# Patient Record
Sex: Female | Born: 1970 | Race: White | Hispanic: No | Marital: Single | Smoking: Never smoker
Health system: Southern US, Community
[De-identification: ages and names within clinical notes are randomized; demographics above are authoritative.]

## PROBLEM LIST (undated history)

## (undated) DIAGNOSIS — E8881 Metabolic syndrome: Secondary | ICD-10-CM

## (undated) DIAGNOSIS — E282 Polycystic ovarian syndrome: Secondary | ICD-10-CM

## (undated) DIAGNOSIS — T7840XA Allergy, unspecified, initial encounter: Secondary | ICD-10-CM

## (undated) DIAGNOSIS — I10 Essential (primary) hypertension: Secondary | ICD-10-CM

## (undated) HISTORY — DX: Essential (primary) hypertension: I10

## (undated) HISTORY — PX: DILATION AND CURETTAGE OF UTERUS: SHX78

## (undated) HISTORY — DX: Polycystic ovarian syndrome: E28.2

## (undated) HISTORY — PX: OTHER SURGICAL HISTORY: SHX169

## (undated) HISTORY — DX: Metabolic syndrome: E88.81

## (undated) HISTORY — DX: Allergy, unspecified, initial encounter: T78.40XA

## (undated) HISTORY — DX: Metabolic syndrome: E88.810

---

## 2007-05-14 ENCOUNTER — Encounter: Payer: Self-pay | Admitting: Family

## 2007-05-14 ENCOUNTER — Ambulatory Visit: Payer: Self-pay | Admitting: Family

## 2007-05-14 LAB — CONVERTED CEMR LAB

## 2007-05-17 ENCOUNTER — Encounter: Admission: RE | Admit: 2007-05-17 | Discharge: 2007-05-17 | Payer: Self-pay | Admitting: Obstetrics & Gynecology

## 2007-05-17 ENCOUNTER — Ambulatory Visit: Payer: Self-pay | Admitting: Family Medicine

## 2007-05-17 DIAGNOSIS — E119 Type 2 diabetes mellitus without complications: Secondary | ICD-10-CM

## 2007-05-17 DIAGNOSIS — E282 Polycystic ovarian syndrome: Secondary | ICD-10-CM

## 2007-05-17 DIAGNOSIS — I10 Essential (primary) hypertension: Secondary | ICD-10-CM | POA: Insufficient documentation

## 2007-05-17 DIAGNOSIS — J309 Allergic rhinitis, unspecified: Secondary | ICD-10-CM | POA: Insufficient documentation

## 2007-05-25 ENCOUNTER — Encounter: Payer: Self-pay | Admitting: Family Medicine

## 2007-05-25 ENCOUNTER — Telehealth (INDEPENDENT_AMBULATORY_CARE_PROVIDER_SITE_OTHER): Payer: Self-pay | Admitting: *Deleted

## 2007-05-29 ENCOUNTER — Encounter: Payer: Self-pay | Admitting: Family Medicine

## 2007-05-30 ENCOUNTER — Encounter: Payer: Self-pay | Admitting: Family Medicine

## 2007-05-30 LAB — CONVERTED CEMR LAB
Albumin: 4.2 g/dL (ref 3.5–5.2)
Alkaline Phosphatase: 62 units/L (ref 39–117)
CO2: 18 meq/L — ABNORMAL LOW (ref 19–32)
Chloride: 104 meq/L (ref 96–112)
Glucose, Bld: 113 mg/dL — ABNORMAL HIGH (ref 70–99)
HDL: 45 mg/dL (ref >39)
MCHC: 32.9 g/dL (ref 30.0–36.0)
MCV: 91.9 fL (ref 78.0–100.0)
Platelets: 252 10*3/uL (ref 150–400)
Sodium: 135 meq/L (ref 135–145)
Total Bilirubin: 1 mg/dL (ref 0.3–1.2)
Total CHOL/HDL Ratio: 3.6
Triglycerides: 139 mg/dL (ref <150)
WBC: 4.4 10*3/uL (ref 4.0–10.5)

## 2007-05-31 ENCOUNTER — Encounter: Payer: Self-pay | Admitting: Family Medicine

## 2007-06-01 ENCOUNTER — Ambulatory Visit: Payer: Self-pay | Admitting: Family Medicine

## 2007-06-04 ENCOUNTER — Ambulatory Visit: Payer: Self-pay | Admitting: Family

## 2007-06-06 ENCOUNTER — Telehealth: Payer: Self-pay | Admitting: Family Medicine

## 2007-06-06 ENCOUNTER — Encounter: Admission: RE | Admit: 2007-06-06 | Discharge: 2007-06-06 | Payer: Self-pay | Admitting: Family Medicine

## 2007-06-11 ENCOUNTER — Telehealth: Payer: Self-pay | Admitting: Family Medicine

## 2007-06-12 ENCOUNTER — Ambulatory Visit: Payer: Self-pay | Admitting: Family Medicine

## 2007-06-13 ENCOUNTER — Encounter: Payer: Self-pay | Admitting: Family Medicine

## 2007-06-13 ENCOUNTER — Telehealth: Payer: Self-pay | Admitting: Family Medicine

## 2007-06-13 LAB — CONVERTED CEMR LAB
Basophils Absolute: 0 10*3/uL (ref 0.0–0.1)
Eosinophils Absolute: 0.2 10*3/uL (ref 0.0–0.7)
Eosinophils Relative: 5 % (ref 0–5)
Hemoglobin: 12.1 g/dL (ref 12.0–15.0)
Lymphocytes Relative: 28 % (ref 12–46)
Lymphs Abs: 1 10*3/uL (ref 0.7–4.0)
MCHC: 32.7 g/dL (ref 30.0–36.0)
Neutro Abs: 2.1 10*3/uL (ref 1.7–7.7)
Platelets: 229 10*3/uL (ref 150–400)
RBC: 4.07 M/uL (ref 3.87–5.11)
RDW: 13 % (ref 11.5–15.5)

## 2007-06-14 ENCOUNTER — Encounter: Payer: Self-pay | Admitting: Family Medicine

## 2007-06-15 ENCOUNTER — Ambulatory Visit: Payer: Self-pay | Admitting: Family Medicine

## 2007-06-16 LAB — CONVERTED CEMR LAB
EBV NA IgG: 5.02 — ABNORMAL HIGH
EBV VCA IgM: 0.25

## 2007-07-03 ENCOUNTER — Encounter: Payer: Self-pay | Admitting: Family Medicine

## 2007-07-17 ENCOUNTER — Ambulatory Visit: Payer: Self-pay | Admitting: Obstetrics and Gynecology

## 2007-11-27 ENCOUNTER — Encounter: Payer: Self-pay | Admitting: Family Medicine

## 2007-12-07 ENCOUNTER — Encounter: Payer: Self-pay | Admitting: Family

## 2007-12-07 ENCOUNTER — Ambulatory Visit: Payer: Self-pay | Admitting: Family

## 2007-12-18 ENCOUNTER — Encounter: Payer: Self-pay | Admitting: Family Medicine

## 2008-02-07 ENCOUNTER — Ambulatory Visit: Payer: Self-pay | Admitting: Family Medicine

## 2008-02-07 DIAGNOSIS — F529 Unspecified sexual dysfunction not due to a substance or known physiological condition: Secondary | ICD-10-CM | POA: Insufficient documentation

## 2008-02-07 LAB — CONVERTED CEMR LAB
Glucose, 2 hr postprandial: 128 mg/dL
Hgb A1c MFr Bld: 5.8 %

## 2008-02-29 ENCOUNTER — Ambulatory Visit: Payer: Self-pay | Admitting: Family Medicine

## 2008-02-29 DIAGNOSIS — H8110 Benign paroxysmal vertigo, unspecified ear: Secondary | ICD-10-CM

## 2008-02-29 LAB — CONVERTED CEMR LAB: Glucose, Bld: 170 mg/dL

## 2008-03-07 ENCOUNTER — Ambulatory Visit: Payer: Self-pay | Admitting: Family Medicine

## 2008-03-07 DIAGNOSIS — M549 Dorsalgia, unspecified: Secondary | ICD-10-CM | POA: Insufficient documentation

## 2008-05-15 ENCOUNTER — Encounter: Admission: RE | Admit: 2008-05-15 | Discharge: 2008-05-15 | Payer: Self-pay | Admitting: Family Medicine

## 2008-05-15 ENCOUNTER — Ambulatory Visit: Payer: Self-pay | Admitting: Family Medicine

## 2008-05-15 DIAGNOSIS — R002 Palpitations: Secondary | ICD-10-CM

## 2008-05-25 ENCOUNTER — Encounter: Payer: Self-pay | Admitting: Family Medicine

## 2008-05-26 ENCOUNTER — Encounter: Admission: RE | Admit: 2008-05-26 | Discharge: 2008-05-26 | Payer: Self-pay | Admitting: Obstetrics & Gynecology

## 2008-05-26 ENCOUNTER — Telehealth: Payer: Self-pay | Admitting: Family Medicine

## 2008-06-12 ENCOUNTER — Ambulatory Visit: Payer: Self-pay | Admitting: Family Medicine

## 2008-06-13 ENCOUNTER — Encounter: Payer: Self-pay | Admitting: Family Medicine

## 2008-06-13 LAB — CONVERTED CEMR LAB
ALT: 42 units/L — ABNORMAL HIGH (ref 0–35)
AST: 22 units/L (ref 0–37)
Alkaline Phosphatase: 56 units/L (ref 39–117)
CO2: 20 meq/L (ref 19–32)
Calcium: 9.3 mg/dL (ref 8.4–10.5)
Cholesterol: 168 mg/dL (ref 0–200)
Glucose, Bld: 107 mg/dL — ABNORMAL HIGH (ref 70–99)
Hgb A1c MFr Bld: 5.8 % (ref 4.6–6.1)
Sodium: 137 meq/L (ref 135–145)
Total CHOL/HDL Ratio: 3.8
Total Protein: 7.3 g/dL (ref 6.0–8.3)

## 2008-06-16 ENCOUNTER — Encounter: Payer: Self-pay | Admitting: Family

## 2008-06-16 ENCOUNTER — Ambulatory Visit: Payer: Self-pay | Admitting: Family

## 2008-12-22 ENCOUNTER — Ambulatory Visit: Payer: Self-pay | Admitting: Family Medicine

## 2008-12-29 ENCOUNTER — Telehealth: Payer: Self-pay | Admitting: Family Medicine

## 2009-01-07 ENCOUNTER — Telehealth: Payer: Self-pay | Admitting: Family Medicine

## 2009-01-15 ENCOUNTER — Encounter: Payer: Self-pay | Admitting: Family Medicine

## 2009-03-10 ENCOUNTER — Encounter: Payer: Self-pay | Admitting: Family Medicine

## 2009-04-13 ENCOUNTER — Telehealth: Payer: Self-pay | Admitting: Family Medicine

## 2009-04-15 ENCOUNTER — Ambulatory Visit: Payer: Self-pay | Admitting: Family Medicine

## 2009-04-15 ENCOUNTER — Encounter: Admission: RE | Admit: 2009-04-15 | Discharge: 2009-04-15 | Payer: Self-pay | Admitting: Family Medicine

## 2009-04-15 DIAGNOSIS — M542 Cervicalgia: Secondary | ICD-10-CM

## 2009-04-15 DIAGNOSIS — M79609 Pain in unspecified limb: Secondary | ICD-10-CM | POA: Insufficient documentation

## 2009-04-17 ENCOUNTER — Encounter: Admission: RE | Admit: 2009-04-17 | Discharge: 2009-04-17 | Payer: Self-pay | Admitting: Family Medicine

## 2009-04-17 LAB — HM MAMMOGRAPHY: HM Mammogram: NEGATIVE

## 2009-06-01 ENCOUNTER — Encounter: Admission: RE | Admit: 2009-06-01 | Discharge: 2009-06-01 | Payer: Self-pay | Admitting: Obstetrics & Gynecology

## 2009-06-02 ENCOUNTER — Encounter: Payer: Self-pay | Admitting: Family Medicine

## 2009-06-18 ENCOUNTER — Ambulatory Visit: Payer: Self-pay | Admitting: Family Medicine

## 2009-06-18 DIAGNOSIS — T887XXA Unspecified adverse effect of drug or medicament, initial encounter: Secondary | ICD-10-CM | POA: Insufficient documentation

## 2009-06-26 ENCOUNTER — Ambulatory Visit: Payer: Self-pay | Admitting: Family

## 2009-07-02 ENCOUNTER — Ambulatory Visit: Payer: Self-pay | Admitting: Family Medicine

## 2009-07-27 ENCOUNTER — Telehealth: Payer: Self-pay | Admitting: Family Medicine

## 2009-07-28 ENCOUNTER — Telehealth: Payer: Self-pay | Admitting: Family Medicine

## 2009-07-29 ENCOUNTER — Encounter: Payer: Self-pay | Admitting: Family Medicine

## 2009-08-17 ENCOUNTER — Ambulatory Visit: Payer: Self-pay | Admitting: Obstetrics and Gynecology

## 2009-08-26 HISTORY — PX: HEEL SPUR EXCISION: SHX1733

## 2009-08-26 HISTORY — PX: BREAST REDUCTION SURGERY: SHX8

## 2009-09-07 ENCOUNTER — Ambulatory Visit: Payer: Self-pay | Admitting: Family Medicine

## 2009-09-07 DIAGNOSIS — J019 Acute sinusitis, unspecified: Secondary | ICD-10-CM

## 2010-04-27 NOTE — Assessment & Plan Note (Signed)
Summary: 2 week fu HTN   Vital Signs:  Patient profile:   40 year old female Height:      63.5 inches Weight:      186 pounds Pulse rate:   86 / minute BP sitting:   136 / 88  (left arm) Cuff size:   regular  Vitals Entered By: Kathlene November (July 02, 2009 4:16 PM) CC: recheck BP- been running 140's/90's   Primary Care Provider:  Nani Gasser MD  CC:  recheck BP- been running 140's/90's.  History of Present Illness: recheck BP- been running 140's/90's.  Hasn't been taking the HCTZ.  Says she will start it.    Current Medications (verified): 1)  Metformin Hcl 500 Mg  Tabs (Metformin Hcl) .... Take One Tablet By Mouth Once A Day 2)  Fluticasone Propionate 50 Mcg/act  Susp (Fluticasone Propionate) .... 2 Sprays in Each Nostril Daily For Allergies 3)  Glucose Monitor Strips and Lancets. .... Accuchek 4)  Nuvaring 0.12-0.015 Mg/24hr Ring (Etonogestrel-Ethinyl Estradiol) 5)  Hydrochlorothiazide 12.5 Mg Caps (Hydrochlorothiazide) .... Take 1 Tablet By Mouth Once A Day 6)  Meclizine Hcl 25 Mg Tabs (Meclizine Hcl) .... One By Mouth Three Times A Day As Needed For Dizziness 7)  Claritin Reditabs 10 Mg Tbdp (Loratadine) .... Take One Tablet By Mouth As Needed  Allergies (verified): 1)  ! Tussionex Pennkinetic Er (Chlorpheniramine-Hydrocodone) 2)  ! Bactrim Ds (Sulfamethoxazole-Trimethoprim)  Physical Exam  General:  Well-developed,well-nourished,in no acute distress; alert,appropriate and cooperative throughout examination Lungs:  Normal respiratory effort, chest expands symmetrically. Lungs are clear to auscultation, no crackles or wheezes. Heart:  Normal rate and regular rhythm. S1 and S2 normal without gallop, murmur, click, rub or other extra sounds. Skin:  no rashes.   Psych:  Cognition and judgment appear intact. Alert and cooperative with normal attention span and concentration. No apparent delusions, illusions, hallucinations   Impression & Recommendations:  Problem #  1:  HYPERTENSION, BENIGN (ICD-401.1) Encourage her to really work on her weight loss and reviewed the DASH diet.  If doing well at that point then can work on decreasing the medication.   Her updated medication list for this problem includes:    Hydrochlorothiazide 12.5 Mg Caps (Hydrochlorothiazide) .Marland Kitchen... Take 1 tablet by mouth once a day  Complete Medication List: 1)  Metformin Hcl 500 Mg Tabs (Metformin hcl) .... Take one tablet by mouth once a day 2)  Fluticasone Propionate 50 Mcg/act Susp (Fluticasone propionate) .... 2 sprays in each nostril daily for allergies 3)  Glucose Monitor Strips and Lancets.  .... Accuchek 4)  Nuvaring 0.12-0.015 Mg/24hr Ring (Etonogestrel-ethinyl estradiol) 5)  Hydrochlorothiazide 12.5 Mg Caps (Hydrochlorothiazide) .... Take 1 tablet by mouth once a day 6)  Meclizine Hcl 25 Mg Tabs (Meclizine hcl) .... One by mouth three times a day as needed for dizziness 7)  Claritin Reditabs 10 Mg Tbdp (Loratadine) .... Take one tablet by mouth as needed 8)  Differin 0.1 % Gel (Adapalene) .... Apply once a day as needed.  Patient Instructions: 1)  DASH diet.  (nih/gov).  2)  Please schedule a follow-up appointment in 3 months .

## 2010-04-27 NOTE — Assessment & Plan Note (Signed)
Summary: BAck and Neck pain s/p fall   Vital Signs:  Patient profile:   40 year old female Height:      63.5 inches Weight:      182 pounds BMI:     31.85 Pulse rate:   90 / minute BP sitting:   147 / 100  (left arm) Cuff size:   regular  Vitals Entered By: Kathlene November (April 15, 2009 10:03 AM) CC: fell monday morning in shower - having alot of neck and back pain- fell again yesterday on ice. Also felt lump under right breast   Primary Care Provider:  Nani Gasser MD  CC:  fell monday morning in shower - having alot of neck and back pain- fell again yesterday on ice. Also felt lump under right breast.  History of Present Illness: fell monday morning in shower  after feeling dizzy, then lost her balance. Fell backward- having alot of neck and back pain- fell again yesterday on ice.  Fell on her right hand.  Now has a HA and radiates down into her neck and back.  Has been in the bed for a cuple of days.  Used some of her uncles meds for pain control.  Took an Aleve. Hx of scoliosis but no prior injuries.  Wears a lift in her left shoe for her scatica on her right.    Also felt lump under right breast about one week ago.Was tender initially but seems better.  Thought initial it was irritation from her bra. No redness or rash. DUe for her screening mammo in March.   Current Medications (verified): 1)  Metformin Hcl 500 Mg  Tabs (Metformin Hcl) .... Take One Tablet By Mouth Once A Day 2)  Fluticasone Propionate 50 Mcg/act  Susp (Fluticasone Propionate) .... 2 Sprays in Each Nostril Daily For Allergies 3)  Glucose Monitor Strips and Lancets. .... Accuchek 4)  Nuvaring 0.12-0.015 Mg/24hr Ring (Etonogestrel-Ethinyl Estradiol) 5)  Minocycline Hcl 100 Mg Caps (Minocycline Hcl) .Marland Kitchen.. 1 By Mouth Two Times A Day 6)  Zyrtec Allergy 10 Mg Tabs (Cetirizine Hcl)  Allergies (verified): 1)  ! Tussionex Pennkinetic Er (Chlorpheniramine-Hydrocodone)  Comments:  Nurse/Medical Assistant: The  patient's medications and allergies were reviewed with the patient and were updated in the Medication and Allergy Lists. 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 Uncles lives near by.  Moved from [place].   Never Smoked Alcohol use-no Drug use-no Regular exercise-no  Physical Exam  General:  Well-developed,well-nourished,in no acute distress; alert,appropriate and cooperative throughout examination Breasts:  small nodule  at the crease between  the breast and skin at the 6 o'clock positons. If is not firm and is not mobile.   Mildly tender. Msk:  Decreased flexion to about 15 degress. Almost no extension of the low back.  Decreased rotation right and left but more decreased on the left.  Very tender over the cervical and lumbar spine.  tender along the thoracid paraspinous muscles between teh spine and scapula. Neck wtih dec ROM  in all directions. Worse to the left.  Strength 4/5 secondary to pain at the shoulders and elbows.   left wrist with NROM.  Tender at the base of the thumb and up to  the DIP on the thumb. Mild swelling at the base. No bruising over the area.  Strength is 4/5 with pinch test between thumb and first finger.   Skin:  no rashes.   Psych:  Cognition and judgment appear intact. Alert and cooperative with normal attention span and concentration. No apparent delusions, illusions, hallucinations   Impression & Recommendations:  Problem # 1:  BACK PAIN (ICD-724.5) Assessment New lots of muscle spasm in her neck and back and shoulder.  Since she is tender over her cervical spine and lumbar spine will get xrays to rule out fracture.  Discussed  treatment with NSAID, muscle relaxer, and as needed hydrocodone. Fu in 2 weeks. Don't lay around in bed. Try to be as active as possible and work on gentle slow streteches.  Will also recheck BP at that time. Think it is elevated secondary to pain. May benefit from PT if not getting better voer the next 2 weeks.   Her updated medication list for this problem includes:    Cyclobenzaprine Hcl 10 Mg Tabs (Cyclobenzaprine hcl) .Marland Kitchen... Take 1 tablet by mouth three times a day as needed as needed muscle spasm    Hydrocodone-acetaminophen 5-500 Mg Tabs (Hydrocodone-acetaminophen) .Marland Kitchen... 1-2 by mouth every 4-6 hours as needed    Diclofenac Sodium 75 Mg Tbec (Diclofenac sodium) .Marland Kitchen... Take 1 tablet by mouth two times a day with foods for inflammation and pain  Orders: T-DG Lumbar Spine 2-3 Views (72100)  Problem # 2:  THUMB PAIN, RIGHT (ICD-729.5) Assessment: New She does have some swelling and tenderness so will get xray to rule out fracture,  though likely just soft tissue injury.  Orders: T-DG Hand 2 View*R* (20254)  Problem # 3:  NECK PAIN (ICD-723.1) Assessment: New See A & P for Back Pain.  Her updated medication list for this problem includes:    Cyclobenzaprine Hcl 10 Mg Tabs (Cyclobenzaprine hcl) .Marland Kitchen... Take 1 tablet by mouth three times a day as needed as needed muscle spasm    Hydrocodone-acetaminophen 5-500 Mg Tabs (Hydrocodone-acetaminophen) .Marland Kitchen... 1-2 by mouth every 4-6 hours as needed    Diclofenac Sodium 75 Mg Tbec (Diclofenac sodium) .Marland Kitchen... Take 1 tablet by mouth two times a day with foods for inflammation and pain  Orders: T-DG Cervical Spine 2-3 Views (27062)  Problem # 4:  LUMP OR MASS IN BREAST (ICD-611.72)  She is due for her screening mammo in march. Noticed lesion about 1 week ago. Will set u pfor diagnositic mamma for further evaluation.  Orders: T-Mammography, Diagnostic (unilateral) (37628)  Complete Medication List: 1)  Metformin Hcl 500 Mg Tabs (Metformin hcl) .... Take  one tablet by mouth once a day 2)  Fluticasone Propionate 50 Mcg/act Susp (Fluticasone propionate) .... 2 sprays in each nostril daily for allergies 3)  Glucose Monitor Strips and Lancets.  .... Accuchek 4)  Nuvaring 0.12-0.015 Mg/24hr Ring (Etonogestrel-ethinyl estradiol) 5)  Minocycline Hcl 100 Mg Caps (Minocycline hcl) .Marland Kitchen.. 1 by mouth two times a day 6)  Zyrtec Allergy 10 Mg Tabs (Cetirizine hcl) 7)  Cyclobenzaprine Hcl 10 Mg Tabs (Cyclobenzaprine hcl) .... Take 1 tablet by mouth three times a day as needed as needed muscle spasm 8)  Hydrocodone-acetaminophen 5-500 Mg Tabs (Hydrocodone-acetaminophen) .Marland Kitchen.. 1-2 by mouth every 4-6 hours as needed 9)  Diclofenac Sodium 75 Mg Tbec (Diclofenac sodium) .... Take 1 tablet by mouth two times a day with foods for inflammation and pain Prescriptions: DICLOFENAC SODIUM 75 MG TBEC (DICLOFENAC SODIUM) Take 1 tablet by mouth two  times a day with foods for inflammation and pain  #40 x 0   Entered and Authorized by:   Nani Gasser MD   Signed by:   Nani Gasser MD on 04/15/2009   Method used:   Printed then faxed to ...       [address] [phone]* (retail)       [address] [place], [place]  [address]       Ph: [phone]       Fax: [phone]   RxID:   [id number] HYDROCODONE-ACETAMINOPHEN 5-500 MG TABS (HYDROCODONE-ACETAMINOPHEN) 1-2 by mouth every 4-6 hours as needed  #40 x 0   Entered and Authorized by:   Nani Gasser MD   Signed by:   Nani Gasser MD on 04/15/2009   Method used:   Printed then faxed to ...       [address] [phone]* (retail)       [address] [place], [place]  [address]       Ph: [phone]       Fax: [phone]   RxID:   [id number] CYCLOBENZAPRINE HCL 10 MG TABS (CYCLOBENZAPRINE HCL) Take 1 tablet by mouth three times a day as needed as needed muscle spasm  #30 x 0   Entered and Authorized by:   Nani Gasser MD   Signed by:   Nani Gasser MD on 04/15/2009    Method used:   Printed then faxed to ...       [address] [phone]* (retail)       [address] [place], [place]  [address]       Ph: [phone]       Fax: [phone]   RxID:   [id number]

## 2010-04-27 NOTE — Progress Notes (Signed)
Summary: prior auth started with [employer]  Phone Note Outgoing Call   Call placed by: marj Call placed to: [facility] Summary of Call: accu chek compact test strips [employer] case # [id number]  Initial call taken by: Roselle Locus,  Jul 28, 2009 1:10 PM     Appended Document: prior auth started with [employer] form from [employer] faxed to [facility]

## 2010-04-27 NOTE — Progress Notes (Signed)
Summary: Test strip and lancets rx  Phone Note Call from Patient   Caller: Patient Call For: Nani Gasser MD Summary of Call: Pt needs rx sent to pharmacy for Accucheck test strips and lancets Initial call taken by: Kathlene November,  Jul 27, 2009 12:55 PM    New/Updated Medications: * ACCUCHEK TEST STRIPS AND LANCET. DX 250.00 Tests 1-2 x a day. Prescriptions: ACCUCHEK TEST STRIPS AND LANCET. DX 250.00 Tests 1-2 x a day.  #90 days supp x PRN   Entered and Authorized by:   Nani Gasser MD   Signed by:   Nani Gasser MD on 07/27/2009   Method used:   Printed then faxed to ...       [facility]* (retail)       [address] [place], [place]  [address]       Ph: [phone]       Fax: [phone]   RxID:   [id number]

## 2010-04-27 NOTE — Progress Notes (Signed)
Summary: Tammy Nicholson  Phone Note Call from Patient Call back at [facility] Phone [phone]   Caller: Patient Call For: Nani Gasser MD Summary of Call: Pt calls and states she fell in the shower this morning and c/o of severe back pain and can not get out of bed and wanting something for pain. Spoke with MD and offered pt an appointment she declined said she cant move so instructed her to call 911 that per MD may need xray Initial call taken by: Kathlene November,  April 13, 2009 8:23 AM  Follow-up for Phone Call        REc get someone to drive her since feels she can't drive. If difficutly for her to get out of bed then I worry that she may have a fracture and does need to be seen. Offered an appt here today and if can't get a ride then needs to call 911 to get her and take her to ED for evaluation.  Follow-up by: Nani Gasser MD,  April 13, 2009 8:30 AM

## 2010-04-27 NOTE — Assessment & Plan Note (Signed)
Summary: ? sinus infection, Congestion & wheezing- jr   Vital Signs:  Patient profile:   40 year old female Height:      63.5 inches Weight:      182 pounds BMI:     31.85 O2 Sat:      98 % on Room air Temp:     98.4 degrees F oral Pulse rate:   74 / minute BP sitting:   125 / 88  (left arm) Cuff size:   regular  Vitals Entered By: Payton Spark CMA (September 07, 2009 11:13 AM)  O2 Flow:  Room air CC: Sinus HA, congestion, facial pressure, cough and post nasal drip x 4 days.   History of Present Illness: 40 yo woman here today for ? sinus infxn.  sxs started friday.  + nasal and head pressure.  + cough productive of yellow sputum.  + PND.  no ear pain, tooth pain.  + subjective fever.  + sore throat.  'i'm sure it's an infxn, i've been through this a lot'.  + sick contacts.  Current Medications (verified): 1)  Metformin Hcl 500 Mg  Tabs (Metformin Hcl) .... Take One Tablet By Mouth Once A Day 2)  Glucose Monitor Strips and Lancets. .... Accuchek 3)  Nuvaring 0.12-0.015 Mg/24hr Ring (Etonogestrel-Ethinyl Estradiol) 4)  Hydrochlorothiazide 12.5 Mg Caps (Hydrochlorothiazide) .... Take 1 Tablet By Mouth Once A Day 5)  Meclizine Hcl 25 Mg Tabs (Meclizine Hcl) .... One By Mouth Three Times A Day As Needed For Dizziness 6)  Claritin Reditabs 10 Mg Tbdp (Loratadine) .... Take One Tablet By Mouth As Needed 7)  Differin 0.1 % Gel (Adapalene) .... Apply Once A Day As Needed. 8)  Accuchek Test Strips and Lancet. .... Dx 250.00 Tests 1 X A Day.  Allergies (verified): 1)  ! Tussionex Pennkinetic Er (Chlorpheniramine-Hydrocodone) 2)  ! Bactrim Ds (Sulfamethoxazole-Trimethoprim)  Review of Systems      See HPI  Physical Exam  General:  Well-developed,well-nourished,in no acute distress; alert,appropriate and cooperative throughout examination Head:  Normocephalic and atraumatic without obvious abnormalities. No apparent alopecia or balding.  + TTP over frontal sinuses Eyes:  no injxn or  inflammation Ears:  L TM dull, poor landmarks, + fluid R TM WNL Nose:  + congestion Mouth:  Oral mucosa and oropharynx without lesions or exudates.  Teeth in good repair.  + PND Neck:  No deformities, masses, or tenderness noted. Lungs:  Normal respiratory effort, chest expands symmetrically. Lungs are clear to auscultation, no crackles or wheezes.  + hacking cough Heart:  Normal rate and regular rhythm. S1 and S2 normal without gallop, murmur, click, rub or other extra sounds.   Impression & Recommendations:  Problem # 1:  SINUSITIS - ACUTE-NOS (ICD-461.9) Assessment New pt's hx and PE conisitent w/ sinusitis and likely early L OM.  start amox.  reviewed supportive care and red flags that should prompt return.  Pt expresses understanding and is in agreement w/ this plan. The following medications were removed from the medication list:    Fluticasone Propionate 50 Mcg/act Susp (Fluticasone propionate) .Marland Kitchen... 2 sprays in each nostril daily for allergies Her updated medication list for this problem includes:    Amoxicillin 500 Mg Tabs (Amoxicillin) .Marland Kitchen... 2 tabs by mouth two times a day x10 days  Complete Medication List: 1)  Metformin Hcl 500 Mg Tabs (Metformin hcl) .... Take one tablet by mouth once a day 2)  Glucose Monitor Strips and Lancets.  .... Accuchek 3)  Nuvaring 0.12-0.015  Mg/24hr Ring (Etonogestrel-ethinyl estradiol) 4)  Hydrochlorothiazide 12.5 Mg Caps (Hydrochlorothiazide) .... Take 1 tablet by mouth once a day 5)  Meclizine Hcl 25 Mg Tabs (Meclizine hcl) .... One by mouth three times a day as needed for dizziness 6)  Claritin Reditabs 10 Mg Tbdp (Loratadine) .... Take one tablet by mouth as needed 7)  Differin 0.1 % Gel (Adapalene) .... Apply once a day as needed. 8)  Accuchek Test Strips and Lancet.  .... Dx 250.00 tests 1 x a day. 9)  Amoxicillin 500 Mg Tabs (Amoxicillin) .... 2 tabs by mouth two times a day x10 days 10)  Diflucan 150 Mg Tabs (Fluconazole) .... Once daily.   may repeat in 72 hours if sxs persist  Patient Instructions: 1)  Follow up as scheduled with your regular doctor 2)  Take the Amoxicillin as directed for your sinuses- take w/ food to avoid upset stomach 3)  Use Mucinex to thin your chest and nasal congestion 4)  Delsym or Robitussin as needed for cough 5)  Use the Diflucan only if yeast develops 6)  Miconazole Cream (Monistat) if you have external itching or burning 7)  Call with any questions or concerns 8)  Hang in there! Prescriptions: HYDROCHLOROTHIAZIDE 12.5 MG CAPS (HYDROCHLOROTHIAZIDE) Take 1 tablet by mouth once a day  #30 x 3   Entered and Authorized by:   Neena Rhymes MD   Signed by:   Neena Rhymes MD on 09/07/2009   Method used:   Print then Give to Patient   RxID:   [id number] METFORMIN HCL 500 MG  TABS (METFORMIN HCL) Take one tablet by mouth once a day  #30 x 5   Entered and Authorized by:   Neena Rhymes MD   Signed by:   Neena Rhymes MD on 09/07/2009   Method used:   Print then Give to Patient   RxID:   [id number] DIFLUCAN 150 MG TABS (FLUCONAZOLE) once daily.  may repeat in 72 hours if sxs persist  #2 x 0   Entered and Authorized by:   Neena Rhymes MD   Signed by:   Neena Rhymes MD on 09/07/2009   Method used:   Electronically to        [facility] [phone]* (retail)       [address] [place], [place]  [address]       Ph: [phone] or [phone]       Fax: [phone]   RxID:   [id number] AMOXICILLIN 500 MG TABS (AMOXICILLIN) 2 tabs by mouth two times a day x10 days  #40 x 0   Entered and Authorized by:   Neena Rhymes MD   Signed by:   Neena Rhymes MD on 09/07/2009   Method used:   [facility] to        [facility] [phone]* (retail)       [address] [place], [place]  [address]       Ph: [phone] or [phone]       Fax: [phone]   RxID:   [id number]

## 2010-04-27 NOTE — Assessment & Plan Note (Signed)
Summary: Drug reaction, HTN   Vital Signs:  Patient profile:   40 year old female Height:      63.5 inches Weight:      182.50 pounds BMI:     31.94 Temp:     98.3 degrees F oral Pulse rate:   105 / minute Pulse rhythm:   regular BP sitting:   147 / 97  (right arm) Cuff size:   regular  Vitals Entered By: Mervin Kung CMA (June 18, 2009 1:28 PM) CC: room 6  Pt states she had body aches, fever and red eyes after taking Sulfamethoxazole from dermatologist on Wednesday.   Primary Care Provider:  Nani Gasser MD  CC:  room 6  Pt states she had body aches and fever and red eyes after taking Sulfamethoxazole from dermatologist on Wednesday.Marland Kitchen  History of Present Illness: room 6  Pt states she had body aches, fever and red eyes after taking Sulfamethoxazole from dermatologist on Wednesday. Started it on Friday afternoon, then later that day got itchy blood shot eyes, feverish, and bodyaches and couldn't sleep.  Was given the original rx in Jan but just filled it last week. Felt better about 48 hours later.  Tried it again yesterday morning with breakfast and then about 1.5 hours later had blood shot ithyc eyes and blurry vision.  Felt a heat all over her body. No prior hx of sulfa allergies. No meds.  NO rash. No new makeup. or eye drops.  No SOB but did feel a heaviness in her chest. Using Aleve for pain relief for the joints.   Allergies: 1)  ! Tussionex Pennkinetic Er (Chlorpheniramine-Hydrocodone) 2)  ! Bactrim Ds (Sulfamethoxazole-Trimethoprim)  Physical Exam  General:  Well-developed,well-nourished,in no acute distress; alert,appropriate and cooperative throughout examination Head:  Normocephalic and atraumatic without obvious abnormalities. No apparent alopecia or balding. Eyes:  Lids are edematous bilaterally. Sclera are injected.  Neck:  No deformities, masses, or tenderness noted. Lungs:  Normal respiratory effort, chest expands symmetrically. Lungs are clear to  auscultation, no crackles or wheezes. Heart:  Normal rate and regular rhythm. S1 and S2 normal without gallop, murmur, click, rub or other extra sounds.   Impression & Recommendations:  Problem # 1:  ADVERSE DRUG REACTION (ICD-995.20) Since got better after first exposure within 24 hours and then on retirla sxs returned within an hour and a half, I do think the had an allergic reacion and most likely to the sulfa component.  Avoid sulfa drugs for now. Says her derm will put her on ampicillin for acne.  Continue her loratidine daily and also can continue her aleve for pain control.    Problem # 2:  HYPERTENSION, BENIGN (ICD-401.1) Dsicused need to restart her BP pill. F/u in 2 weeks to recheck.  Her updated medication list for this problem includes:    Hydrochlorothiazide 12.5 Mg Caps (Hydrochlorothiazide) .Marland Kitchen... Take 1 tablet by mouth once a day  Complete Medication List: 1)  Metformin Hcl 500 Mg Tabs (Metformin hcl) .... Take one tablet by mouth once a day 2)  Fluticasone Propionate 50 Mcg/act Susp (Fluticasone propionate) .... 2 sprays in each nostril daily for allergies 3)  Glucose Monitor Strips and Lancets.  .... Accuchek 4)  Nuvaring 0.12-0.015 Mg/24hr Ring (Etonogestrel-ethinyl estradiol) 5)  Zyrtec Allergy 10 Mg Tabs (Cetirizine hcl) 6)  Hydrochlorothiazide 12.5 Mg Caps (Hydrochlorothiazide) .... Take 1 tablet by mouth once a day 7)  Meclizine Hcl 25 Mg Tabs (Meclizine hcl) .... One by mouth three times a  day as needed for dizziness Prescriptions: MECLIZINE HCL 25 MG TABS (MECLIZINE HCL) one by mouth three times a day as needed for dizziness  #30 x 0   Entered and Authorized by:   Nani Gasser MD   Signed by:   Nani Gasser MD on 06/18/2009   Method used:   Electronically to        [facility]* (retail)       [address] [place], [place]  [address]       Ph: [phone]       Fax: [phone]   RxID:   [id number] HYDROCHLOROTHIAZIDE 12.5 MG CAPS  (HYDROCHLOROTHIAZIDE) Take 1 tablet by mouth once a day  #30 x 3   Entered and Authorized by:   Nani Gasser MD   Signed by:   Nani Gasser MD on 06/18/2009   Method used:   Electronically to        [facility]* (retail)       [address] [place], [place]  [address]       Ph: [phone]       Fax: [phone]   RxID:   [id number]   Current Allergies (reviewed today): ! Sandria Senter ER (CHLORPHENIRAMINE-HYDROCODONE) ! BACTRIM DS (SULFAMETHOXAZOLE-TRIMETHOPRIM)

## 2010-04-27 NOTE — Letter (Signed)
Summary: [facility]  [facility]   Imported By: Lanelle Bal 06/11/2009 12:13:22  _____________________________________________________________________  External Attachment:    Type:   Image     Comment:   External Document

## 2010-04-27 NOTE — Medication Information (Signed)
Summary: Prior Authorization & Denial for Accu Check Strips/[employer]  Prior Authorization & Denial for Accu Check Strips/[employer]   Imported By: Lanelle Bal 08/14/2009 09:48:05  _____________________________________________________________________  External Attachment:    Type:   Image     Comment:   External Document

## 2010-04-27 NOTE — Letter (Signed)
Summary: Out of Work  [facility]  [address]   [place], [place] [address]   Phone: [phone]  Fax: [phone]    June 18, 2009   Employee:  Torrance Voit    To Whom It May Concern:   For Medical reasons, please excuse the above named employee from work for the following dates:  Start:   06-17-2009  End:   05-22-2009  If you need additional information, please feel free to contact our office.         Sincerely,    Nani Gasser MD

## 2010-06-22 ENCOUNTER — Telehealth: Payer: Self-pay | Admitting: *Deleted

## 2010-06-22 DIAGNOSIS — I10 Essential (primary) hypertension: Secondary | ICD-10-CM

## 2010-06-22 MED ORDER — HYDROCHLOROTHIAZIDE 12.5 MG PO CAPS: 12.5000 mg | ORAL_CAPSULE | Freq: Every day | ORAL | Status: DC

## 2010-06-22 NOTE — Telephone Encounter (Signed)
Pt called and requested a refill of BP med. Pt has not been seen since 6/11. Will refill once and pt will make appt

## 2010-06-27 ENCOUNTER — Encounter: Payer: Self-pay | Admitting: Family Medicine

## 2010-06-29 ENCOUNTER — Ambulatory Visit (INDEPENDENT_AMBULATORY_CARE_PROVIDER_SITE_OTHER): Payer: 59 | Admitting: Family Medicine

## 2010-06-29 ENCOUNTER — Encounter: Payer: Self-pay | Admitting: Family Medicine

## 2010-06-29 DIAGNOSIS — J309 Allergic rhinitis, unspecified: Secondary | ICD-10-CM

## 2010-06-29 DIAGNOSIS — I10 Essential (primary) hypertension: Secondary | ICD-10-CM

## 2010-06-29 DIAGNOSIS — Z3200 Encounter for pregnancy test, result unknown: Secondary | ICD-10-CM

## 2010-06-29 DIAGNOSIS — E348 Other specified endocrine disorders: Secondary | ICD-10-CM

## 2010-06-29 MED ORDER — FLUTICASONE PROPIONATE 50 MCG/ACT NA SUSP
2.0000 | Freq: Every day | NASAL | Status: DC
Start: 2010-06-29 — End: 2010-10-21

## 2010-06-29 MED ORDER — HYDROCHLOROTHIAZIDE 25 MG PO TABS: 25.0000 mg | ORAL_TABLET | Freq: Every day | ORAL | Status: DC

## 2010-06-29 NOTE — Progress Notes (Signed)
Subjective:    Patient ID: Tammy Nicholson, female    DOB: 03-08-1971, 40 y.o.   MRN: [id number]  Hypertension This is a chronic problem. Associated symptoms include blurred vision, headaches, malaise/fatigue, palpitations, peripheral edema and sweats. Pertinent negatives include no chest pain or shortness of breath. There are no associated agents to hypertension. Past treatments include diuretics. The current treatment provides no improvement. There are no compliance problems.   Occ taking 2 tabs of BP pills to bring it down.  Noticed a pressure sensation in he rhead when her BP is high.   Persistant year round allergies.  Nasal congestion, drip, post nasal drip, sinus pressure and sneezing. Has been taking her zyrtec but would like to restart a nasal steroid spray.  WAnts to avoid going back to ENT bc of expensive copay.  Has had a few episodes of feeling shakey before lunch. Says will often happen if doesn't eat much breakfast. Sxs improve if she eats something. Worse if skips a meal.    Review of Systems  Constitutional: Positive for malaise/fatigue.  Eyes: Positive for blurred vision.  Respiratory: Negative for shortness of breath.   Cardiovascular: Positive for palpitations. Negative for chest pain.  Neurological: Positive for headaches.   BP 138/93  Pulse 99  Ht 5' 2.5" (1.588 m)  Wt 188 lb (85.276 kg)  BMI 33.84 kg/m2    Allergies  Allergen Reactions  . Sulfamethoxazole W/Trimethoprim     REACTION: ithcy eyes, joint aches  . Tussionex Pennkinetic Er     REACTION: Rash    Past Medical History  Diagnosis Date  . Polycystic ovarian disease   . Insulin resistance syndrome     Past Surgical History  Procedure Date  . Dilation and curettage of uterus   . Deviated septum repair   . Breast reduction surgery 08/2009    Dr. Etter Sjogren  . Heel spur excision 08/2009    Dr. Yates Decamp     History   Social History  . Marital Status: Single    Spouse Name: N/A    Number  of Children: N/A  . Years of Education: N/A   Occupational History  . Not on file.   Social History Main Topics  . Smoking status: Never Smoker   . Smokeless tobacco: Not on file  . Alcohol Use: No  . Drug Use: No  . Sexually Active:    Other Topics Concern  . Not on file   Social History Narrative  . No narrative on file    Family History  Problem Relation Age of Onset  . Diabetes Father   . Hyperlipidemia Father   . Hypertension Father   . Coronary artery disease Father   . Cancer Maternal Aunt     breast  . Diabetes Other   . Diabetes Other       Objective:   Physical Exam  Vitals reviewed. Constitutional: She appears well-developed and well-nourished.  HENT:  Head: Normocephalic and atraumatic.  Right Ear: External ear normal.  Left Ear: External ear normal.  Nose: Nose normal.  Mouth/Throat: Oropharynx is clear and moist.  Eyes: Conjunctivae and EOM are normal. Pupils are equal, round, and reactive to light.  Neck: Normal range of motion. Neck supple. No thyromegaly present.  Cardiovascular: Normal rate, regular rhythm and normal heart sounds.   Pulmonary/Chest: Effort normal and breath sounds normal.  Lymphadenopathy:    She has no cervical adenopathy.          Assessment & Plan:

## 2010-06-29 NOTE — Patient Instructions (Signed)
We are increasing your blood pressure pill Try to get your fasting labs before I see you in about 3 weeks

## 2010-06-29 NOTE — Assessment & Plan Note (Signed)
She is taking allegra bid. Will add a nasal steroid spray. She gets a lot of nasal sxs with her year round allergies.  Can consider addition nasal antihistamine spray if still not well controlled at f/u in 3 wk.

## 2010-06-29 NOTE — Assessment & Plan Note (Addendum)
Not at goal. Change regimen, inc HCTZ to 25mg   and recheck in 1 month. If not at goal in one month then add ACEi. Overdue for labs. Given lab slip to take to lab next week.

## 2010-06-29 NOTE — Assessment & Plan Note (Signed)
I think her shakiness before lunch is likely hypoglyecmia from the metformin. May have to snack bt breakfast and lunch to maintain blood sugar or we can look at trying to dec her metformin.

## 2010-07-20 ENCOUNTER — Other Ambulatory Visit: Payer: Self-pay | Admitting: Family Medicine

## 2010-07-20 LAB — LIPID PANEL
Cholesterol: 167 mg/dL (ref 0–200)
LDL Cholesterol: 110 mg/dL — ABNORMAL HIGH (ref 0–99)
Total CHOL/HDL Ratio: 5.1 Ratio
Triglycerides: 118 mg/dL (ref <150)
VLDL: 24 mg/dL (ref 0–40)

## 2010-07-21 ENCOUNTER — Telehealth: Payer: Self-pay | Admitting: Family Medicine

## 2010-07-21 LAB — COMPLETE METABOLIC PANEL WITH GFR
ALT: 36 U/L — ABNORMAL HIGH (ref 0–35)
Albumin: 4.5 g/dL (ref 3.5–5.2)
BUN: 13 mg/dL (ref 6–23)
CO2: 20 mEq/L (ref 19–32)
Chloride: 104 mEq/L (ref 96–112)
Creat: 0.55 mg/dL (ref 0.40–1.20)
GFR, Est African American: >60 mL/min (ref >60)
GFR, Est Non African American: >60 mL/min (ref >60)
Glucose, Bld: 111 mg/dL — ABNORMAL HIGH (ref 70–99)
Potassium: 4.1 mEq/L (ref 3.5–5.3)

## 2010-07-21 NOTE — Telephone Encounter (Signed)
Call patient: Blood sugar still in the prediabetic range but stable. LDL cholesterol is just a little above goal. Make sure working on healthy diet with low-cholesterol and low-fat and regular exercise. Recheck  cholesterol one year.

## 2010-07-21 NOTE — Telephone Encounter (Signed)
Pt.notified

## 2010-07-22 ENCOUNTER — Ambulatory Visit (INDEPENDENT_AMBULATORY_CARE_PROVIDER_SITE_OTHER): Payer: 59 | Admitting: Family Medicine

## 2010-07-22 VITALS — BP 131/86 | HR 71 | Ht 62.5 in | Wt 187.0 lb

## 2010-07-22 DIAGNOSIS — R1031 Right lower quadrant pain: Secondary | ICD-10-CM

## 2010-07-22 MED ORDER — HYDROCODONE-ACETAMINOPHEN 5-325 MG PO TABS: 1.0000 | ORAL_TABLET | ORAL | Status: DC | PRN

## 2010-07-22 NOTE — Progress Notes (Signed)
  Subjective:    Patient ID: Tammy Nicholson, female    DOB: Sep 26, 1970, 40 y.o.   MRN: [id number]  HPI  Tues was doing floor exercises.  Now having pain in the left groin.  Stool was soft and has been gassy. More painful when stands up and walk.  Feels a pulling senstation. Felt hot last night.  Had a mild HA last night. Took Tylenol last night and helped her HA and some of her pain, but still couldn't sleep.  Pain occ radiates over to the right. Started her period this am.  + sexually active.  No back pain.   Review of Systems     Objective:   Physical Exam  Constitutional: She appears well-developed and well-nourished.  HENT:  Head: Normocephalic and atraumatic.  Cardiovascular: Normal rate, regular rhythm and normal heart sounds.        Radial pulse 2+   Pulmonary/Chest: Effort normal and breath sounds normal.  Abdominal: Soft. Bowel sounds are normal. She exhibits no distension and no mass. There is tenderness.       Tender over the suprapubic area and in the LLQ. Very tender with very light palpation. I don(t feel a masse or hernia but she is so tender I couldn't palpate deeply.    Skin: Skin is warm and dry.  Psychiatric: She has a normal mood and affect.          Assessment & Plan:  RLQ pain and tenderness. I don't feel a hernia on exam. Because she has had fever will check UA nad CBC today. Has also had more loose bowels yesterday. Consider diverticulitis though she is fairly young for that. In the meantime did give her some stronger medication for pain to use. If the pain become severe tonight she needs to go to the emergency room immediately. We should have her CBC before 5 PM today to give her a call. She might need further imaging with a CAT scan.

## 2010-07-22 NOTE — Patient Instructions (Signed)
We will call you with the lab results. 

## 2010-07-23 ENCOUNTER — Ambulatory Visit: Payer: 59 | Admitting: Family Medicine

## 2010-07-23 ENCOUNTER — Telehealth: Payer: Self-pay | Admitting: Family Medicine

## 2010-07-23 DIAGNOSIS — R1032 Left lower quadrant pain: Secondary | ICD-10-CM

## 2010-07-23 LAB — CBC WITH DIFFERENTIAL/PLATELET
Eosinophils Relative: 1 % (ref 0–5)
HCT: 41.9 % (ref 36.0–46.0)
Hemoglobin: 14.4 g/dL (ref 12.0–15.0)
Lymphocytes Relative: 16 % (ref 12–46)
Lymphs Abs: 1.4 10*3/uL (ref 0.7–4.0)
MCV: 92.1 fL (ref 78.0–100.0)
Monocytes Relative: 6 % (ref 3–12)
Platelets: 223 10*3/uL (ref 150–400)
RBC: 4.55 MIL/uL (ref 3.87–5.11)
WBC: 8.7 10*3/uL (ref 4.0–10.5)

## 2010-07-23 LAB — COMPLETE METABOLIC PANEL WITH GFR
ALT: 25 U/L (ref 0–35)
CO2: 22 mEq/L (ref 19–32)
Calcium: 9.1 mg/dL (ref 8.4–10.5)
Chloride: 102 mEq/L (ref 96–112)
Creat: 0.54 mg/dL (ref 0.40–1.20)
GFR, Est African American: >60 mL/min (ref >60)
Glucose, Bld: 151 mg/dL — ABNORMAL HIGH (ref 70–99)
Sodium: 137 mEq/L (ref 135–145)
Total Protein: 7.1 g/dL (ref 6.0–8.3)

## 2010-07-23 NOTE — Telephone Encounter (Signed)
Call pt: Blood count is normal. No sign of infection.  Blood sugar was high but I don't think she was fasting. If was fasting then let me know.  Otherwise rest of metabolic panel was nl.

## 2010-07-23 NOTE — Telephone Encounter (Signed)
Will schedule for Abdmoinal/pelvic CT.

## 2010-07-23 NOTE — Telephone Encounter (Signed)
Pt notified with lab results and told her that provider would be ordering a CT abdomen/pelvis and that [facility] would be calling her next week since they don't do these over the weekend.  Pt voiced understanding. Jarvis Newcomer, LPN Domingo Dimes

## 2010-07-23 NOTE — Telephone Encounter (Signed)
Pt notified of results wants to know what to do  Since everything was negative

## 2010-07-27 ENCOUNTER — Telehealth: Payer: Self-pay | Admitting: *Deleted

## 2010-07-27 DIAGNOSIS — R102 Pelvic and perineal pain: Secondary | ICD-10-CM

## 2010-07-27 NOTE — Telephone Encounter (Signed)
OK. Thank you

## 2010-07-27 NOTE — Telephone Encounter (Signed)
Tammy Nicholson from [facility] states the order for abd CT needs to be with contrast. Pt is going tomorrow am. Will precert for with contrast

## 2010-07-28 ENCOUNTER — Telehealth: Payer: Self-pay | Admitting: Family Medicine

## 2010-07-28 DIAGNOSIS — R1032 Left lower quadrant pain: Secondary | ICD-10-CM

## 2010-07-28 NOTE — Telephone Encounter (Signed)
Pt notifed.

## 2010-07-28 NOTE — Telephone Encounter (Signed)
Pt says CT is too expensvie. Would like to start with [place].  Will put in order for [place].

## 2010-07-29 ENCOUNTER — Telehealth: Payer: Self-pay | Admitting: Family Medicine

## 2010-07-29 ENCOUNTER — Ambulatory Visit
Admission: RE | Admit: 2010-07-29 | Discharge: 2010-07-29 | Disposition: A | Discharge status: Home / Self Care | Payer: 59 | Source: Ambulatory Visit | Attending: Family Medicine | Admitting: Family Medicine

## 2010-07-29 DIAGNOSIS — R1032 Left lower quadrant pain: Secondary | ICD-10-CM

## 2010-07-29 NOTE — Telephone Encounter (Signed)
Called and it said magic jack num has not been assigned

## 2010-07-29 NOTE — Telephone Encounter (Signed)
Call pt: [place] doesn't reveal a cuase of her Left lower abdomainl pain.  Are her bowels moving normally? If so then we can consider a CT if not better after the weeknd. If feeling some better then we can consider waiting a few more days and see if continues to improve.

## 2010-07-30 NOTE — Telephone Encounter (Signed)
Spoke with pt and she states she feels a lot better but she still has some abd discomfort. Pt states she is having BM but she is straining some. Pt states she was on her period when she starting having the pain and maybe this was contributing to her pain per pt.Advised pt ok to wait through the weekend since she is not having as much pain and if still having discomfort on Monday then call back

## 2010-08-10 NOTE — Assessment & Plan Note (Signed)
NAME:  Tammy Nicholson, Tammy Nicholson            ACCOUNT NO.:  [id number]   MEDICAL RECORD NO.:  [id number]          PATIENT TYPE:  POB   LOCATION:  [facility]         FACILITY:  [facility]   PHYSICIAN:  Sid Falcon, CNM  DATE OF BIRTH:  1970/07/12   DATE OF SERVICE:  05/14/2007                                  CLINIC NOTE   ADDENDUM:   GYN HISTORY:  Had an abnormal Pap smear in 2004, low grade squamous  epithelial lesion.  Had a colposcopy which was negative.  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  Pap smears after that exam  were normal.  Mammogram in 2006 was normal.  Has not had a followup  mammogram.      Sid Falcon, CNM     WM/MEDQ  D:  05/14/2007  T:  05/15/2007  Job:  [id number]

## 2010-08-10 NOTE — Assessment & Plan Note (Signed)
NAME:  Tammy Nicholson, Tammy Nicholson            ACCOUNT NO.:  [id number]   MEDICAL RECORD NO.:  [id number]          PATIENT TYPE:  POB   LOCATION:  [facility] at [facility]         FACILITY:  [facility]   PHYSICIAN:  Caren Griffins, CNM       DATE OF BIRTH:  [birth year]   DATE OF SERVICE:  07/17/2007                                  CLINIC NOTE   REASON FOR VISIT:  Vaginal discharge and itching.   HISTORY OF PRESENT ILLNESS:  This is a 40 year old, nulliparous,  pleasant, white female who has a history of having had frequent yeast  infections and called here on June 11, 2007, stating she recently had  finished an antibiotic and having vaginal itching and white discharge.  It is known that she is diabetic on metformin and that she is also on  Yaz for her PCOS.  Therefore, at that time, she was called in for  Diflucan 150, #1 with 1 refill.  She took 1 and a few days later took  another and is still symptomatic now 5 days later.  She is describing a  thick, white and yellowish discharge with irritation and itching of the  vagina and vulva.  She also got no relief from over-the-counter Monistat  which she used externally last night.   OBJECTIVE:  GENERAL:  WN, WD, NAD.  BMI is 30.  Blood pressure is  122/80.  Serum glucose is not obtained and the patient will obtain a CBG  at home using her meter since she is infrequently checking blood sugars  and does not check fastings despite taking her metformin 500 mg every  day.  The last she remembers she had a post prandial of 110.  Documented  in our record of June 04, 2007, she had a random 113.  PELVIC:  External genitalia is normal other than mild erythema of  vestibule and labia minora, not really extending to labia majora.  Vagina is well-rugated and slightly inflamed.  There is a large amount  of thick white, mixed with yellow-white discharge.  Cervix without  lesions or erythema.  Bimanual with uterus small, retroverted.  No  adnexal tenderness or masses.   Wet-prep is positive for yeast and some  RBCs.  No Trichomonas.  No clue cells.   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   PLAN:  The patient is given a prescription for Terazol 7 vaginal cream  with 1 refill so that she can use this externally as well as  intravaginally for up to 2 weeks.  Concurrently, she will use boric acid  capsules 600 mg intravaginally nightly x10 days.  She agrees to call in  her blood sugar and we again stressed glycemic control and monitoring  her blood sugars as well as weight loss, which she is having trouble  with which is due to her sedentary job.  Discussed walking more and  other ways to increase her exercise.  She will continue on Yaz as she is  happy with this and asymptomatic with PCOS.  She has not been sexually  active x2 years, but would like to be protected in case that changes.  ______________________________  Caren Griffins, CNM     DP/MEDQ  D:  07/17/2007  T:  07/17/2007  Job:  [id number]

## 2010-08-10 NOTE — Assessment & Plan Note (Signed)
NAME:  Tammy Nicholson, Tammy Nicholson            ACCOUNT NO.:  [id number]   MEDICAL RECORD NO.:  [id number]          PATIENT TYPE:  POB   LOCATION:  [facility]         FACILITY:  [facility]   PHYSICIAN:  Sid Falcon, CNM  DATE OF BIRTH:  October 16, 1970   DATE OF SERVICE:  06/26/2009                                  CLINIC NOTE   REASON FOR VISIT:  Well-woman exam.  The patient is reporting no change  in medical history in the past year, is now sexually active and with a  new partner x2 months, and is desiring pregnancy, and has questions  regarding fertility awareness.  The patient is continuing to see Dr.  Susann Givens for her impaired glucose tolerance for which she is continuing  to take metformin twice a day.  In addition, she is also getting care  there for her high blood pressure and is currently taking  hydrochlorothiazide 25 mg daily.  No new medical diagnosis for family  members as well.   PHYSICAL EXAMINATION:  VITAL SIGNS:  Pulse 87, blood pressure 127/92,  weight 183.  GENERAL:  The patient is alert and oriented x3.  NECK:  No thyromegaly.  CARDIOVASCULAR SYSTEM:  Regular rate and rhythm without murmurs,  gallops, or rubs.  LUNGS:  Clear to auscultation bilaterally.  BREASTS:  Soft, nontender.  No dominant masses.  No nipple discharge.  No retractions.  Negative mammogram in March of this year.  ABDOMEN:  Positive bowel sounds x4.  No hepatosplenomegaly.  PELVIC:  No abnormal lesions noted.  No abnormal discharge.  Cervix;  patent os.  Pap smear obtained without difficulty.  The uterus difficult  to palpate secondary to habitus, however, nontender with palpation.  Adnexa; nontender with palpation.   ASSESSMENT:  1. Well-woman exam.  2. Fertility awareness.   PLAN:  The patient desires refill for NuvaRing in case changes mind for  pregnancy.  Prescription written for NuvaRing 1 q.month, refills 11.  Reviewed fertility awareness and given fertility chart and explained how  to  use the chart, how to document discharge as well as temperature.  I  advised to continue condoms until no pregnancy is desired.  The patient  will continue to see Dr. Susann Givens for her chronic health issues and may  follow up in 3 months if she would like to review her charts and discuss  her pregnancy intent.  Continue with your vitamins that you were taking  with the appropriate dose of folic acid.      Sid Falcon, CNM     WM/MEDQ  D:  06/26/2009  T:  06/27/2009  Job:  [id number]

## 2010-08-10 NOTE — Assessment & Plan Note (Signed)
NAME:  Tammy Nicholson, Tammy Nicholson            ACCOUNT NO.:  [id number]   MEDICAL RECORD NO.:  [id number]          PATIENT TYPE:  POB   LOCATION:  [facility]         FACILITY:  [facility]   PHYSICIAN:  Sid Falcon, CNM  DATE OF BIRTH:  12-Sep-1970   DATE OF SERVICE:                                  CLINIC NOTE   Patient was seen at [facility] back on May 14, 2007.  Patient was seen at that date for pelvic pain.  Has a medical diagnosis  of polycystic ovarian syndrome, and patient was reporting intermittent  pelvic pain lasting approximately one day.  The patient is here for  follow-up results.   The results from that visit was a Pap smear which was negative for  intrathelial lesions, pelvic ultrasound, no abnormal findings.  The TSH  was 1.404, within normal limits, and the results of the mammogram are  pending.  We will call the patient with results.  Patient is to follow  up in six months for a re-Pap due to prior history of an abnormal Pap.   Patient has been seen at the family practice office for followup on her  high blood pressure and borderline diabetes.  The patient due to a  recent illness will need to follow up at a later visit to address known  hypertension and glucose intolerance.      Sid Falcon, CNM     WM/MEDQ  D:  06/04/2007  T:  06/05/2007  Job:  [id number]

## 2010-08-10 NOTE — Assessment & Plan Note (Signed)
NAME:  Tammy Nicholson, Tammy Nicholson            ACCOUNT NO.:  [id number]   MEDICAL RECORD NO.:  [id number]          PATIENT TYPE:  POB   LOCATION:  [facility]         FACILITY:  [facility]   PHYSICIAN:  Sid Falcon, CNM  DATE OF BIRTH:  May 31, 1970   DATE OF SERVICE:  06/16/2008                                  CLINIC NOTE   CHIEF COMPLAINT:  Scheduled well-woman exam and repeat Pap smear, also  desires to discuss other birth control options.   The patient reports no change in medical or family history in the past  year.  The patient verbalizes not wanting to take pills daily.  She  reports that does not need methods for her sexual activity, as she is  not sexually active at this time.   PHYSICAL EXAMINATION:  GENERAL:  Alert and oriented x3.  No signs of  acute distress.  NECK:  No thyromegaly.  No dominant masses, nontender with palpation.  CHEST:  Breasts pendulous, soft, nontender, no dominant masses.  No  nipple discharge.  No retractions.  Her last mammogram of March 2010  negative.  CARDIOVASCULAR SYSTEM:  Regular rate and rhythm without murmurs,  gallops, or rubs.  LUNGS:  Clear to auscultation bilaterally.  ABDOMEN:  Soft, nontender, positive bowel sounds x4.  No  hepatosplenomegaly.  PELVIS:  Her vagina is without lesions, with abnormal discharge.  No  redness.  Cervix; patent os, negative lesions.  Negative cervical motion  tenderness.  No abnormal discharge.  Uterus; mobile, nontender with  palpation.  Adnexa is nontender with palpation.  No dominant masses.   ASSESSMENT:  1. Well-woman exam.  2. Family planning counseling.   PLAN:  After much discussion, the patient decided up on the NuvaRing.  Is on last week of pills.  We will insert NuvaRing on June 26, 2008, and  use as directed.  Pap smear sent to lab.  If negative, can go to the q.  year Pap smear schedule.  Follow up as needed.      Sid Falcon, CNM     WM/MEDQ  D:  06/16/2008  T:  06/17/2008   Job:  [id number]

## 2010-08-10 NOTE — Assessment & Plan Note (Signed)
NAME:  Tammy Nicholson, Tammy Nicholson            ACCOUNT NO.:  [id number]   MEDICAL RECORD NO.:  [id number]          PATIENT TYPE:  POB   LOCATION:  [facility]         [facility]AC[facility]   PHYSICIAN:  Sid Falcon, CNM  DATE OF BIRTH:  02/21/1971   DATE OF SERVICE:                                  CLINIC NOTE   CHIEF COMPLAINT:  The patient is here for 59-month re-Pap and discussion  of oral contraceptives for birth control methods.   Upon discussion with the patient, the patient is also reporting back  pain due to her heavy breasts and is considering reduction surgery to  help with the discomfort.  The patient has also had a new relationship  x3 months and is considering becoming sexually active and also would  like to have STD screening today.  In addition, the patient is taking  oral contraception of an oral antibiotic and would like a Diflucan pill  prophylactically in case had a yeast infection does develop.  The  patient denies any changes in medical history.  She reports that her  blood sugars have been under control with her fasting being within about  90-95 in the morning when she checks and her blood sugars in the evening  in the below 120s, and is continuing follow up care with her primary  care Anwar Crill.   PHYSICAL EXAMINATION:  BREAST:  Extremely pendulous.  Breast exam done  per patient request.  Breast nontender, soft.  No dominant masses.  No  retractions.  No dimpling.  No nipple discharge.  The patient again  instructed on how to do breast exams.  PELVIC:  Vagina, no abnormal discharge.  No abnormal lesions.  Pap smear  obtained without difficulty.   ASSESSMENT:  1. Back pain related to breast size.  2. Re-Pap.  3. Family planning counseling.  4. STD screening.   PLAN:  Labs HIV, RPR, chlamydia and gonorrhea, and Pap smear.  Hep panel  was recently done and it was negative.  Instructed and advised for  condom use for sexually transmitted infection prevention.   Prescription  for Diflucan 150 mg 1 pill, no refills for vaginal itching if it  develops.  Continue follow up with primary care Mitchael Luckey for diabetes.  I have discussed in length the various birth control options due to  patient not wanting to insert anything in the vagina and declining an  Implanon and no previous birth.  It was decided that the oral  contraceptive method would be the preferred method to continue.  The  patient is to follow up in 6 months for well-woman exam or sooner if  needed.      Sid Falcon, CNM    WM/MEDQ  D:  12/07/2007  T:  12/08/2007  Job:  [id number]

## 2010-08-10 NOTE — Assessment & Plan Note (Signed)
NAME:  Tammy Nicholson, Tammy Nicholson            ACCOUNT NO.:  [id number]   MEDICAL RECORD NO.:  [id number]          PATIENT TYPE:  POB   LOCATION:  [facility] at [facility]         FACILITY:  [facility]   PHYSICIAN:  Sid Falcon, CNM  DATE OF BIRTH:  1970-09-01   DATE OF SERVICE:  05/14/2007                                  CLINIC NOTE   Ms. Aloura is here for an annual exam.  Currently reports headaches,  night sweats, and recent weight gain.  Feels the headaches are related  to glasses, needing an additional prescription.   OB/GYN HISTORY:  Nulla gravida with prior attempts of pregnancy which  were unsuccessful.  Did have a fertility workup consisting of a sterile  salpingogram in 2002 which was normal.  Her labs done at that time were  an [facility] which was normal, an LH, normal, the 17p was elevated at 42.  The  patient was diagnosed with polycystic ovarian syndrome and was placed on  oral contraception for management.  The patient reporting left-sided  intermittent pelvic pain lasting approximately 1 day.  Not a consistent  pain.   PAST MEDICAL HISTORY:  The patient diagnosed with impaired glucose  tolerance by a 75 g oral glucose tolerance test.  The fasting was 102,  and the 2-hour result was 172, and her hemoglobin A1c was 6.0, 2006.  Also, she has had a history of endometrial polyps which were biopsied  and found to be benign.   PRIOR SURGERIES:  A deviated nasal septum repair in 2000, and the  endometrial polyp removal in 2002.   CURRENT MEDICATIONS:  1. Zyrtec D.  2. Yaz.  3. Metformin.  4. Acne creams prescribed by a dermatologist.   IMMUNIZATIONS:  Up to date.   SOCIAL HISTORY:  The patient is single, living alone, no current  partner, desires a relationship.  Works 3 jobs.  Has increased stress  per patient due to financial obligations and desires to lose weight.   FAMILY HISTORY:  Father, paternal grandmother and paternal aunt with  type 2 diabetes.  Dad, history of heart  disease.  Has 3 stents.  No  history of MI per patient.  Father and aunt with high blood pressure.  Maternal aunt previously diagnosed with breast cancer and died from this  diagnosis.  The patient denies smoking, drinking, and does drink 2 diet  sodas per day.  No illicit drug use.   PHYSICAL EXAMINATION:  Upon exam, the patient was alert and oriented x3,  overweight.  HEAD/EYES/EARS/NOSE/THROAT:  Normocephalic.  NECK:  No lymphadenopathy or thyromegaly.  CHEST:  Lungs clear to auscultation.  HEART:  Regular rate and rhythm, no murmurs, gallops, or rubs.  BREASTS:  No dominant mass palpated.  Soft, nontender, no nipple  discharge bilaterally.  ABDOMEN:  Nontender, no hepatosplenomegaly.  EXTREMITIES:  Trace pedal edema bilaterally.  No varicosities.  PELVIC EXAM:  External genitalia:  No lesions, no redness, no abnormal  discharge.  Vagina rugated.  White, adherent discharge, no odor.  Cervix:  Approximately 2 x 2 cm.  No cervical motion tenderness.  Patent  os without discharge.  No lesions.  Uterus:  Small, midline, mobile,  nontender.  Adnexa:  No masses  or tenderness bilaterally.   ASSESSMENT:  1. Elevated blood pressure, 140/85.  2. Empiric glucose tolerance.  3. Intermittent pelvic pain.  4. Well woman exam with Pap and pelvic.   PLAN:  The patient has a scheduled appointment with a family practice  doctor on February the 18th.  Advised to keep appointment and to discuss  elevated blood pressure and the empiric glucose tolerance.  Will discuss  at this appointment management for pregestational diabetes and  reassessment of the hemoglobin A1c.  Pelvic ultrasound ordered.  Pap  smear sent to lab.  Labs obtained also include thyroid stimulating  hormone.  Referral to receive a screening mammogram as well.  The  patient agrees to enroll in Doyl[employer] Bode for weight loss management.  Prescriptions:  Yaz 1 pill daily.  Refills for up to a year's supply.  Follow up in 2 weeks to  review pelvic ultrasound.      Sid Falcon, CNM     WM/MEDQ  D:  05/14/2007  T:  05/15/2007  Job:  [id number]

## 2010-09-20 ENCOUNTER — Other Ambulatory Visit: Payer: Self-pay | Admitting: Family Medicine

## 2010-09-24 ENCOUNTER — Other Ambulatory Visit: Payer: Self-pay | Admitting: Obstetrics and Gynecology

## 2010-09-24 ENCOUNTER — Ambulatory Visit (INDEPENDENT_AMBULATORY_CARE_PROVIDER_SITE_OTHER): Payer: 59

## 2010-09-24 DIAGNOSIS — Z01419 Encounter for gynecological examination (general) (routine) without abnormal findings: Secondary | ICD-10-CM

## 2010-09-24 DIAGNOSIS — Z113 Encounter for screening for infections with a predominantly sexual mode of transmission: Secondary | ICD-10-CM

## 2010-09-24 DIAGNOSIS — Z1272 Encounter for screening for malignant neoplasm of vagina: Secondary | ICD-10-CM

## 2010-09-24 DIAGNOSIS — N912 Amenorrhea, unspecified: Secondary | ICD-10-CM

## 2010-09-28 NOTE — Assessment & Plan Note (Signed)
NAME:  Tammy Nicholson, Tammy Nicholson            ACCOUNT NO.:  [id number]  MEDICAL RECORD NO.:  [id number]           PATIENT TYPE:  LOCATION:  [facility] at [facility]           FACILITY:  PHYSICIAN:  Jamisyn Langer, CNM            DATE OF BIRTH:  DATE OF SERVICE:                                 CLINIC NOTE  REASON FOR VISIT:  Annual exam and desires pregnancy.  HISTORY:  This is a 40 year old nulligravida who has been attempting to conceive for the past 4 months.  Of note, in addition to her age, she carries a diagnosis of PCOS based on oligomenorrhea which she states she has 2 menses a year and on appearance with truncal obesity and hirsutism as well as her abnormal glucose metabolism for which she is on metformin and she has mild chronic hypertension.  She had a negative TSH and an negative pelvic ultrasound here in 2009.  In addition, she wants to do all the STI testing as she has a new partner and she has a small lesion near her anus which she would like looked at.  Her LMP was in January and was heavy and 2010 when she was on Loestrin she did have some regularity to her menses.  She was given NuvaRing about a year ago here, but she only uses it a month or 2.  She gets yearly mammograms due to having a maternal aunt who had breast cancer and also relative who she states had ovarian cancer.  Her interval history is significant for having had a breast reduction surgery since she was last seen here and she is doing well with that and has had resolution of her back pain, however, she is concerned with the appearance of the scar.  PHYSICAL EXAMINATION:  VITAL SIGNS:  Pulse 91, BP 127/87, weight 184 which is essentially unchanged from the year ago. GENERAL:  NAD.  Of note, there is truncal obesity and mild hirsutism of face and abdomen. NECK:  Thyroid. NSSP. CORONARY:  RRR without murmur. LUNGS:  CTA bilateral. BREASTS:  Symmetric.  No discrete masses.  No nipple discharge. Scarring present from her  procedure.  No lymphadenopathy. ABDOMEN:  Obese, soft, nontender. PELVIC:  Physiologic discharge. NEFG:  Cervix posterior without lesions.  Pap sent.  Unable to outline uterus due to body habitus.  There was no tenderness.  No adnexal tenderness appreciated.  About 1 cm from anal verge, there is a 2-mm dark red apparent nevus and it is nontender.  IMPRESSION AND PLAN: 1. Annual exam. 2. Polycystic ovarian syndrome.  In consultation with Dr. Penne Lash, we     will go ahead and get a fasting prolactin level, DHEA-S, TSH, total     testosterone, 17 hydroxy progesterone, and cystic fibrosis testing. 3. Desires STI testing.  She will also get an RPR, HIV, hepatitis B     and C when she comes back for her labs and GC Chlamydia are sent     today and desires pregnancy.  She started on a prenatal vitamin. 4. Oligomenorrhea.  She is given a prescription for Provera 10 mg one     p.o. daily for 14 days after she has her withdrawal bleed she  should call for and make an appointment to review her lab results     and discuss fertility.          ______________________________ Caren Griffins, CNM    DP/MEDQ  D:  09/24/2010  T:  09/24/2010  Job:  [id number]

## 2010-10-12 ENCOUNTER — Other Ambulatory Visit: Payer: Self-pay | Admitting: Obstetrics and Gynecology

## 2010-10-13 LAB — TESTOSTERONE: Testosterone: 51 ng/dL (ref 10–70)

## 2010-10-13 LAB — TSH: TSH: 0.777 u[IU]/mL (ref 0.350–4.500)

## 2010-10-13 LAB — PROLACTIN: Prolactin: 10.5 ng/mL

## 2010-10-16 LAB — 17-HYDROXYPROGESTERONE: 17-OH-Progesterone, LC/MS/MS: 78 ng/dL

## 2010-10-21 ENCOUNTER — Ambulatory Visit
Admission: RE | Admit: 2010-10-21 | Discharge: 2010-10-21 | Disposition: A | Discharge status: Home / Self Care | Payer: 59 | Source: Ambulatory Visit | Attending: Family Medicine | Admitting: Family Medicine

## 2010-10-21 ENCOUNTER — Telehealth: Payer: Self-pay | Admitting: Family Medicine

## 2010-10-21 ENCOUNTER — Ambulatory Visit (INDEPENDENT_AMBULATORY_CARE_PROVIDER_SITE_OTHER): Payer: 59 | Admitting: Family Medicine

## 2010-10-21 ENCOUNTER — Encounter: Payer: Self-pay | Admitting: Family Medicine

## 2010-10-21 VITALS — BP 138/92 | HR 82 | Wt 184.0 lb

## 2010-10-21 DIAGNOSIS — M25519 Pain in unspecified shoulder: Secondary | ICD-10-CM

## 2010-10-21 DIAGNOSIS — F411 Generalized anxiety disorder: Secondary | ICD-10-CM

## 2010-10-21 DIAGNOSIS — F329 Major depressive disorder, single episode, unspecified: Secondary | ICD-10-CM

## 2010-10-21 DIAGNOSIS — F419 Anxiety disorder, unspecified: Secondary | ICD-10-CM | POA: Insufficient documentation

## 2010-10-21 MED ORDER — FLUOXETINE HCL 20 MG PO TABS: ORAL_TABLET | ORAL | Status: DC

## 2010-10-21 MED ORDER — HYDROCODONE-ACETAMINOPHEN 5-325 MG PO TABS: 1.0000 | ORAL_TABLET | Freq: Every evening | ORAL | Status: AC | PRN

## 2010-10-21 NOTE — Telephone Encounter (Signed)
Referrrals entered.

## 2010-10-21 NOTE — Assessment & Plan Note (Signed)
Her GAD-7 score is 21 today. She has severe symptoms. We discussed treatment with counseling, exercise, medication. She said she was okay with starting a medication and with counseling. We will make a referral for the counseling. As far as medications we discussed potential side effects of SSRIs. She will follow up in approximately 3 weeks to see how she's doing on the medication

## 2010-10-21 NOTE — Telephone Encounter (Signed)
Call patient: X-ray of the shoulder is normal except for some downsloping of the acromion which can sometimes put her at higher risk of impingement in the shoulder. I would like to set her up for physical therapy of her shoulder. If this is okay let me know and I will put in a referral.Or if she prefers I could set her up with one of the sports medicine doctors downstairs to be evaluated.

## 2010-10-21 NOTE — Telephone Encounter (Signed)
Pt would like to see a sports med doc and wants referral to a psych.

## 2010-10-21 NOTE — Progress Notes (Signed)
Subjective:    Patient ID: Tammy Nicholson, female    DOB: [birth year], 40 y.o.   MRN: [id number]  HPI  She is here today to discuss her anxiety. She says that the last 2 months it got worse. She is now having panic attacks. She is very easily distracted at work. She is not sleeping well. She has occasions where she feels palpitations and has difficulty catching her breath. She feels in general her body aches are worse. She has not had any prior medications. She has a lot of stressful things going on in her life right now.  Right shoulder pain. It is painful to turn over and sleep on the shoulder. Occasionally will feel like a sharp pain but most of the time it is a dull ache. She has been taking ibuprofen and which doesn't seem to help anymore. She has difficulty reaching above her head. No real alleviating symptoms at this time. She denies any trauma to that shoulder. She said she did try the exercises that I've given her in the past but has not been consistent with them. No prior x-rays or films.  Review of Systems     BP 138/92  Pulse 82  Wt 184 lb (83.462 kg)    Allergies  Allergen Reactions  . Sulfamethoxazole W/Trimethoprim     REACTION: ithcy eyes, joint aches  . Tussionex Pennkinetic Er     REACTION: Rash    Past Medical History  Diagnosis Date  . Polycystic ovarian disease   . Insulin resistance syndrome     Past Surgical History  Procedure Date  . Dilation and curettage of uterus   . Deviated septum repair   . Breast reduction surgery 08/2009    Dr. Etter Sjogren  . Heel spur excision 08/2009    Dr. Yates Decamp     History   Social History  . Marital Status: Single    Spouse Name: N/A    Number of Children: N/A  . Years of Education: N/A   Occupational History  . Not on file.   Social History Main Topics  . Smoking status: Never Smoker   . Smokeless tobacco: Not on file  . Alcohol Use: No  . Drug Use: No  . Sexually Active:    Other Topics Concern  .  Not on file   Social History Narrative  . No narrative on file    Family History  Problem Relation Age of Onset  . Diabetes Father   . Hyperlipidemia Father   . Hypertension Father   . Coronary artery disease Father   . Cancer Maternal Aunt     breast  . Diabetes Other   . Diabetes Other     Ms. Corkins had no medications administered during this visit.  Objective:   Physical Exam  Constitutional: She appears well-developed and well-nourished.  Cardiovascular: Normal rate, regular rhythm and normal heart sounds.   Pulmonary/Chest: Effort normal and breath sounds normal.  Musculoskeletal:       She's tender over the right anterior part of the shoulder. She's also slightly tender at the deltoid. She has difficulty getting her shoulder past 90. She is unable to touch her low back. She is settling more weak in shoulder and has a positive empty can test.          Assessment & Plan:  Right shoulder pain-suspect possibly a rotator cuff tear or problem with the supraspinatous. We will start with an x-ray today since her pain has been going  on for several months. If this is fairly normal and I recommend putting her into physical therapy. She was struck with getting to her appointments for PT; also consider setting her up with sports medicine for possible injection and home physical therapy exercises.

## 2010-10-29 ENCOUNTER — Telehealth: Payer: Self-pay | Admitting: Family Medicine

## 2010-10-29 MED ORDER — ZOLPIDEM TARTRATE 10 MG PO TABS: 10.0000 mg | ORAL_TABLET | Freq: Every evening | ORAL | Status: DC | PRN

## 2010-10-29 NOTE — Telephone Encounter (Signed)
Prescription sent

## 2010-10-29 NOTE — Telephone Encounter (Signed)
Pt called and said she was seen recently and was given med for anxiety.  The provider told her if she was still having problems with sleep after starting the med to call in for a sleep aid such as ambien.  Pt is calling to request the sleep aid since still having problems resting at night. Plan:  Routed to Dr. Marlyne Beards, LPN [facility]

## 2010-10-29 NOTE — Telephone Encounter (Signed)
Pt informed that a generic script for Remus Loffler was sent to her pharm. Jarvis Newcomer, LPN [facility]

## 2010-11-02 ENCOUNTER — Ambulatory Visit (HOSPITAL_COMMUNITY): Payer: 59 | Admitting: Psychology

## 2010-11-10 ENCOUNTER — Other Ambulatory Visit: Payer: Self-pay | Admitting: Family Medicine

## 2010-11-16 ENCOUNTER — Encounter: Payer: Self-pay | Admitting: Family Medicine

## 2010-11-16 ENCOUNTER — Other Ambulatory Visit: Payer: Self-pay | Admitting: Family Medicine

## 2010-11-16 ENCOUNTER — Encounter (HOSPITAL_COMMUNITY): Payer: 59 | Admitting: Psychology

## 2010-11-17 ENCOUNTER — Ambulatory Visit: Payer: 59 | Admitting: Obstetrics & Gynecology

## 2010-11-17 ENCOUNTER — Telehealth: Payer: Self-pay | Admitting: Family Medicine

## 2010-11-17 NOTE — Telephone Encounter (Signed)
Pt called and stated she is running out of her ambien early.  Last refill date 10-29-10 for # 30/0refills because she done fine for 3 weeks on 1 pill at [facility], then she started having the problem that the medication was not lasting long enough, and at that point increase the med to 1 1/2 pills at [facility].  Please advise if medication can be refilled and what to do with the med adjustment. Routed to Dr. Marlyne Beards, LPN [facility]

## 2010-11-17 NOTE — Telephone Encounter (Signed)
Call pt: !0mg  is the max dose. If not lasting long enough then need to change to the CR (extended release) not go up on the dose. We can refill when she is due for the CR if she would like.

## 2010-11-17 NOTE — Telephone Encounter (Signed)
Pt notified that she cannot have an early refill.  Told her she will be due again on 11-29-10, and at that time we can prescribe the ambien CR 12.5 mg to see if that will do better for her.  Pt voiced understanding. Jarvis Newcomer, LPN [facility]

## 2010-11-23 ENCOUNTER — Ambulatory Visit (INDEPENDENT_AMBULATORY_CARE_PROVIDER_SITE_OTHER): Payer: 59 | Admitting: Family Medicine

## 2010-11-23 ENCOUNTER — Encounter: Payer: Self-pay | Admitting: Family Medicine

## 2010-11-23 DIAGNOSIS — F419 Anxiety disorder, unspecified: Secondary | ICD-10-CM

## 2010-11-23 DIAGNOSIS — M25511 Pain in right shoulder: Secondary | ICD-10-CM | POA: Insufficient documentation

## 2010-11-23 DIAGNOSIS — F411 Generalized anxiety disorder: Secondary | ICD-10-CM

## 2010-11-23 DIAGNOSIS — Z23 Encounter for immunization: Secondary | ICD-10-CM

## 2010-11-23 DIAGNOSIS — M25519 Pain in unspecified shoulder: Secondary | ICD-10-CM

## 2010-11-23 MED ORDER — ZOLPIDEM TARTRATE ER 12.5 MG PO TBCR: 12.5000 mg | EXTENDED_RELEASE_TABLET | Freq: Every evening | ORAL | Status: DC | PRN

## 2010-11-23 NOTE — Progress Notes (Signed)
  Subjective:    Patient ID: Su Monks, female    DOB: December 05, 1970, 40 y.o.   MRN: [id number]  HPI Anxiety - Hs been stressed and less focused. Did start fluoxetine. Felt a pop in her ear and felt an electrical shock in her head.  No HA. She does feel her mood is better.  Sleeping was better the first 2 weeks and then sleep got worse so restarted taking half an [place].  She was contacted about a referral for counseling but she has't made the appt yet  Rt shoulder pain. - Also made ref to ohto but she hasn't sched that yet either.  We did review her xray results today.    Review of Systems     Objective:   Physical Exam  Constitutional: She is oriented to person, place, and time. She appears well-developed and well-nourished.  Neurological: She is alert and oriented to person, place, and time.  Skin: Skin is warm and dry.  Psychiatric: She has a normal mood and affect. Her behavior is normal.          Assessment & Plan:

## 2010-11-23 NOTE — Assessment & Plan Note (Addendum)
GAD-7 score of 15. Mild improvement. Discussed options of increasing her dose and keeping same dose and f/u in 1 mo. She would like to keep currnet dose. F/U in 1 month and repeat GAD-7 score. Discussed trying to get in with  Counseling. I think Tammy Nicholson would be really helpful for her. She is also not getting any regular exercis.

## 2010-11-23 NOTE — Assessment & Plan Note (Signed)
Discussed still needs to see ortho. She doesn't want to to PT at this time.

## 2010-12-02 ENCOUNTER — Other Ambulatory Visit: Payer: Self-pay | Admitting: Family Medicine

## 2010-12-02 MED ORDER — FLUOXETINE HCL 20 MG PO TABS: ORAL_TABLET | ORAL | Status: DC

## 2010-12-02 NOTE — Telephone Encounter (Signed)
Pt called and stated she was recently seen, and our office was suppose to send her a refill for her prozac, but the pharm didn't have. Plan:  Reviewed the pt chart and the pt is correct.  The refill was not sent.  Sent a 30 day supply since pt was instructed to return in 1 mth for the depression. Jarvis Newcomer, LPN [facility]

## 2010-12-21 ENCOUNTER — Other Ambulatory Visit: Payer: Self-pay | Admitting: Family Medicine

## 2010-12-21 ENCOUNTER — Ambulatory Visit (INDEPENDENT_AMBULATORY_CARE_PROVIDER_SITE_OTHER): Payer: 59 | Admitting: Obstetrics & Gynecology

## 2010-12-21 ENCOUNTER — Encounter: Payer: Self-pay | Admitting: Family Medicine

## 2010-12-21 ENCOUNTER — Encounter: Payer: Self-pay | Admitting: Obstetrics & Gynecology

## 2010-12-21 VITALS — BP 122/84 | HR 84 | Temp 98.0°F | Resp 17 | Ht 63.0 in | Wt 185.0 lb

## 2010-12-21 DIAGNOSIS — N970 Female infertility associated with anovulation: Secondary | ICD-10-CM

## 2010-12-21 MED ORDER — CLOMIPHENE CITRATE 50 MG PO TABS: 50.0000 mg | ORAL_TABLET | Freq: Every day | ORAL | Status: DC

## 2010-12-21 MED ORDER — MEDROXYPROGESTERONE ACETATE 10 MG PO TABS: 10.0000 mg | ORAL_TABLET | Freq: Every day | ORAL | Status: DC

## 2010-12-21 NOTE — Patient Instructions (Signed)
Polycystic Ovarian Syndrome (PCOS) Polycystic ovarian syndrome is a condition with a number of problems. One problem is with the ovaries. The ovaries are organs located in the female pelvis, on each side of the uterus. Usually, during the menstrual cycle, an egg is released from 1 ovary every month. This is called ovulation. When the egg is fertilized, it goes into the womb (uterus), which allows for the growth of a baby. The egg travels from the ovary through the fallopian tube to the uterus. The ovaries also make the hormones estrogen and progesterone. These hormones help the development of a woman's breasts, body shape, and body hair. They also regulate the menstrual cycle and pregnancy. Sometimes, cysts form in the ovaries. A cyst is a fluid-filled sac. On the ovary, different types of cysts can form. The most common type of ovarian cyst is called a functional or ovulation cyst. It is normal, and often forms during the normal menstrual cycle. Each month, a woman's ovaries grow tiny cysts that hold the eggs. When an egg is fully grown, the sac breaks open. This releases the egg. Then, the sac which released the egg from the ovary dissolves. In one type of functional cyst, called a follicle cyst, the sac does not break open to release the egg. It may actually continue to grow. This type of cyst usually disappears within 1 to 3 months.  One type of cyst problem with the ovaries is called Polycystic Ovarian Syndrome (PCOS). In this condition, many follicle cysts form, but do not rupture and produce an egg. This health problem can affect the following:  Menstrual cycle.  Heart.   Obesity.   Cancer of the uterus.   Fertility.  Blood vessels.   Hair growth (face and body) or baldness.   Hormones.  Appearance.   High blood pressure.   Stroke.   Insulin production.  Inflammation of the liver.   Elevated blood cholesterol and triglycerides.   SYMPTOMS  Infrequent or no menstrual periods,  and/or irregular bleeding.   Inability to get pregnant (infertility), because of not ovulating.   Increased growth of hair on the face, chest, stomach, back, thumbs, thighs, or toes.   Acne, oily skin, or dandruff.   Pelvic pain.   Weight gain or obesity, usually carrying extra weight around the waist.   Type 2 diabetes (this is the diabetes that usually does not need insulin).   High cholesterol.   High blood pressure.   Female-pattern baldness or thinning hair.   Patches of thickened and dark brown or black skin on the neck, arms, breasts, or thighs.   Skin tags, or tiny excess flaps of skin, in the armpits or neck area.   Sleep apnea (excessive snoring and breathing stops at times while asleep).   Deepening of the voice.   Gestational diabetes when pregnant.   Increased risk of miscarriage with pregnancy.  WOMEN WITH PCOS HAVE THESE CHARACTERISTICS:  High levels of female hormones called androgens.   An irregular or no menstrual cycle.   May have many small cysts in their ovaries.   An estimated 5 to 10 percent of women of childbearing age have PCOS.  PCOS is the most common hormonal reproductive problem in women of childbearing age. CAUSES  No one knows the exact cause of PCOS.   Women with PCOS often have a mother or sister with PCOS. There is not yet enough proof to say this is inherited.   Many women with PCOS have a weight problem.     Researchers are looking at the relationship between PCOS and the body's ability to make insulin. Insulin is a hormone that regulates the change of sugar, starches, and other food into energy for the body's use, or for storage. Some women with PCOS make too much insulin. It is possible that the ovaries react by making too many female hormones, called androgens. This can lead to acne, excessive hair growth, weight gain, and ovulation problems.   Too much production of luteinizing hormone (LH) from the pituitary gland in the brain  stimulates the ovary to produce too much female hormone (androgen).  WHY DO WOMEN WITH PCOS HAVE TROUBLE WITH THEIR MENSTRUAL CYCLE? Each month, about 20 eggs start to mature in the ovaries. As one egg grows and matures, the follicle breaks open to release the egg, so it can travel through the fallopian tube for fertilization. When the single egg leaves the follicle, ovulation takes place. In women with PCOS, the ovary does not make all of the hormones it needs for any of the eggs to fully mature. They may start to grow and accumulate fluid, but no one egg becomes large enough. Instead, some may remain as cysts. Since no egg matures or is released, ovulation does not occur and the hormone progesterone is not made. Without progesterone, a woman's menstrual cycle is irregular or absent. Also, the cysts produce female hormones, which continue to prevent ovulation.  DIAGNOSIS There is no single test to diagnose PCOS.   Your caregiver will:   Take a medical history.  Perform a pelvic exam.   Perform an ultrasound.   Check your female and female hormone levels.  Measure glucose or sugar levels in the blood.   Do other blood tests.    If you are producing too many female hormones, your caregiver will make sure it is from PCOS. At the physical exam, your caregiver will want to evaluate the areas of increased hair growth. Try to allow natural hair growth for a few days before the visit.   During a pelvic exam, the ovaries may be enlarged or swollen by the increased number of small cysts. This can be seen more easily by vaginal ultrasound or screening, to examine the ovaries and lining of the uterus (endometrium) for cysts. The uterine lining may become thicker, if there has not been a regular period.  TREATMENT Because there is no cure for PCOS, it needs to be managed to prevent problems. Treatments are based on your symptoms. Treatment is also based on whether you want to have a baby or whether you need  contraception.  Treatment may include:  Progesterone hormone, to start a menstrual period.   Birth control pills, to make you have regular menstrual periods.   Medicines to make you ovulate, if you want to get pregnant.   Medicines to control your insulin.   Medicine to control your blood pressure.   Medicine and diet, to control your high cholesterol and triglycerides in your blood.   Surgery, making small holes in the ovary, to decrease the amount of female hormone production. This is done through a long, lighted tube (laparoscope), placed into the pelvis through a tiny incision in the lower abdomen.  Your caregiver will go over some of the choices with you. Document Released: 07/08/2004 Document Re-Released: 09/01/2009 [employer] Patient Information 2011 [employer]. 

## 2010-12-21 NOTE — Progress Notes (Signed)
  Subjective:    Patient ID: Tammy Nicholson, female    DOB: 1970/10/21, 40 y.o.   MRN: [id number]  HPI Patient would like to become pregnant. She had a period for 2 months. The first period was induced with Provera. She did have one period on her own. She is late today for her menses. Urine pregnancy test is negative. Patient has never had a hysterosalpingogram and her partner has never had any semen analysis. We discussed doing these tests first before starting Clomid. We also discussed direct referral to a reproductive endocrinologist. The patient would like to start Clomid before any more testing. The patient understands her age and referral to a reproductive endocrinologist to 3 cycles of Clomid do not work.  The patient also understands that if her fallopian tubes are blocked or her partnerssemen is not adequate, Clomid will not work. Patient has PCOS and was counseled that she had a 10% risk of twins on Clomid   Review of Systems  Constitutional: Negative.   Respiratory: Negative.   Cardiovascular: Negative.   Gastrointestinal: Negative.   Genitourinary: Negative.        Objective:   Physical Exam   Not repeated     Assessment & Plan:  He should not pregnant today. Patient will wait one more week and take another pregnancy test. If the pregnancy test is still negative. The patient will take Provera or 10 days. Once she starts bleeding this will be day 1 of her cycle. The patient will come in on day 3 of her cycle or FSH and anti Mllerian hormone levels.  The patient will also begin Clomid 50 mg daily for 5 days. The patient will return on day 21 of her cycle to get a progesterone level.  She does not have her period within 30-35 days. The patient is to take a pregnancy test if negative, then she will do another Provera withdrawal bleed. We can restart Clomid at that time at 100 mg.  After 3 cycles of Clomid. I strongly suggest the patient is a reproductive endocrinologist  given her  age.

## 2010-12-27 ENCOUNTER — Ambulatory Visit (INDEPENDENT_AMBULATORY_CARE_PROVIDER_SITE_OTHER): Payer: 59 | Admitting: Family Medicine

## 2010-12-27 VITALS — BP 128/89 | HR 87 | Wt 184.0 lb

## 2010-12-27 DIAGNOSIS — G4700 Insomnia, unspecified: Secondary | ICD-10-CM

## 2010-12-27 DIAGNOSIS — Z23 Encounter for immunization: Secondary | ICD-10-CM

## 2010-12-27 DIAGNOSIS — K219 Gastro-esophageal reflux disease without esophagitis: Secondary | ICD-10-CM

## 2010-12-27 MED ORDER — ZOLPIDEM TARTRATE ER 6.25 MG PO TBCR: 6.2500 mg | EXTENDED_RELEASE_TABLET | Freq: Every evening | ORAL | Status: DC | PRN

## 2010-12-27 NOTE — Progress Notes (Signed)
  Subjective:    Patient ID: Tammy Nicholson, female    DOB: 1970-06-27, 40 y.o.   MRN: [id number]  HPI Insomnia - doing well on the 12.5 ambien CR. Was on the ambien 10mg  daily but says it was not lasting long enough so we switched her to CR.  She is feeling groggy in the morning.  Wakes up around 6:15.  Feels groggy until 9 AM.  But she says that it works well and is hesitant to change it. No other side effects.  Has been more belchy and gassy. Will feel prickles in her chest at times. She has been using Gas-X and it does seem to help. No vomiting or diarrhea. She denies any recent changes in her diet.  Review of Systems     Objective:   Physical Exam  Constitutional: She is oriented to person, place, and time. She appears well-developed and well-nourished.  HENT:  Head: Normocephalic and atraumatic.  Eyes: Conjunctivae are normal. Pupils are equal, round, and reactive to light.  Cardiovascular: Normal rate, regular rhythm and normal heart sounds.   Pulmonary/Chest: Effort normal and breath sounds normal.  Neurological: She is alert and oriented to person, place, and time.  Skin: Skin is warm and dry.  Psychiatric: She has a normal mood and affect. Her behavior is normal.          Assessment & Plan:  Insominia - I really thinks she is too sedated thought I don't think she disagrees.  Will dec her to the Ambien to 6.24 CR daily for 10 days. Afer that she can call me and let me know if working well. I think she is getting too sedated on her current dose. She may respond very well to the lower dose.  GERD- likely GERD based on her symptoms. I would like to try an over-the-counter PPI for one to 2 weeks and if this resolves her symptoms. If it does not I recommend that she followup for further evaluation. Consider that she could be developing lactose intolerance or gluten sensitivity.

## 2010-12-27 NOTE — Patient Instructions (Signed)
Call me in 10 days and let me know what your think about your sleep medicine.

## 2011-01-04 ENCOUNTER — Telehealth: Payer: Self-pay | Admitting: Family Medicine

## 2011-01-04 MED ORDER — ZOLPIDEM TARTRATE 10 MG PO TABS: 10.0000 mg | ORAL_TABLET | Freq: Every evening | ORAL | Status: AC | PRN

## 2011-01-04 NOTE — Telephone Encounter (Signed)
No. It is a safety issue and I am not going to rx a med that I know is too sedating.

## 2011-01-04 NOTE — Telephone Encounter (Signed)
Pt called and said she had been given ambien CR 6.25mg  and had been on X 10 days.  Pt was suppose to call to let us know if working or not.  Pt states she prefers to go back to using 12.5 mg. Plan:  Routed to Dr. Linford Arnold. Jarvis Newcomer, LPN [facility]

## 2011-01-04 NOTE — Telephone Encounter (Signed)
I still think she is too sedate on the 12.5mg . We can go back to the 10 mg regular release.

## 2011-01-04 NOTE — Telephone Encounter (Signed)
Pt notified and told will switch back to the 10 mg regular release, and pt doesn't wish to do this.  Says does not work well and she wishes to go back to 12.5 mg CR because the regular release was allowing her to wake up during the night.  Please advise. Plan:  Routed to Dr. Marlyne Beards, LPN [facility]

## 2011-01-05 ENCOUNTER — Telehealth: Payer: Self-pay | Admitting: Family Medicine

## 2011-01-05 MED ORDER — FLUOXETINE HCL 20 MG PO TABS: ORAL_TABLET | ORAL | Status: DC

## 2011-01-05 NOTE — Telephone Encounter (Signed)
Completed. Seren Chaloux, LPN /Triage   

## 2011-01-05 NOTE — Telephone Encounter (Signed)
Pt informed that Dr. Linford Arnold is not going to fill the ambien CR 12.5 mg since too sedating.  RX sent for ambien 10 mg plain to her pharm.  Pt also requesting refill for her prozac 20 mg.  #30/2 refills were sent to her pharm as well. Jarvis Newcomer, LPN [facility]

## 2011-01-06 ENCOUNTER — Ambulatory Visit: Payer: 59 | Admitting: Family Medicine

## 2011-01-10 ENCOUNTER — Telehealth: Payer: Self-pay | Admitting: *Deleted

## 2011-01-10 NOTE — Telephone Encounter (Signed)
Pt called stating that she srated her CC yesterday on Sun.  She is to come into the office on 10/30 for a progesterone level.  Pt states that she is also going to be doing ovulation predictor kits.

## 2011-01-24 ENCOUNTER — Ambulatory Visit (INDEPENDENT_AMBULATORY_CARE_PROVIDER_SITE_OTHER): Payer: 59 | Admitting: Advanced Practice Midwife

## 2011-01-24 ENCOUNTER — Encounter: Payer: Self-pay | Admitting: Advanced Practice Midwife

## 2011-01-24 VITALS — BP 146/96 | HR 101 | Temp 97.0°F | Resp 17 | Ht 64.0 in | Wt 179.0 lb

## 2011-01-24 DIAGNOSIS — Z113 Encounter for screening for infections with a predominantly sexual mode of transmission: Secondary | ICD-10-CM

## 2011-01-24 NOTE — Progress Notes (Signed)
Addended by: Granville Lewis on: 01/24/2011 04:51 PM   Modules accepted: Orders

## 2011-01-24 NOTE — Progress Notes (Signed)
Subjective:     Tammy Nicholson is a 40 y.o. female here for a STD check.  Current complaints: none.  Pt states she recently found out that her partner was having sex with other women. She was having vaginal and anal intercourse with this partner. She denies any pain, lesions, discharge. She requests complete STD testing.    Gynecologic History Patient's last menstrual period was 01/05/2011. Contraception: none, pt was planning on attempting conception prior to this recent occurence   Obstetric History OB History    Grav Para Term Preterm Abortions TAB SAB Ect Mult Living   0 0 0 0 0 0 0 0 0 0          Review of Systems Pertinent items are noted in HPI.    Objective:    BP 146/96  Pulse 101  Temp(Src) 97 F (36.1 C) (Oral)  Resp 17  Ht 5\' 4"  (1.626 m)  Wt 81.194 kg (179 lb)  BMI 30.73 kg/m2  LMP 01/05/2011 General appearance: alert, cooperative and no distress Pelvic: cervix normal in appearance, external genitalia normal, no adnexal masses or tenderness, no cervical motion tenderness, rectovaginal septum normal, uterus normal size, shape, and consistency and moderate amount of white nonmalodorous vaginal d/c Skin: Skin color, texture, turgor normal. No rashes or lesions    Assessment:    STD check, Chronic HTN managed by PCP.    Plan:    Education reviewed: safe sex/STD prevention. GC/CT, Affirm, HIV, HSV, RPR, Hep B &C ordered per patient's request   F/U with PCP re: elevated BP

## 2011-01-25 ENCOUNTER — Other Ambulatory Visit: Payer: 59

## 2011-01-25 LAB — HEPATITIS C ANTIBODY: HCV Ab: NEGATIVE

## 2011-01-25 LAB — HIV ANTIBODY (ROUTINE TESTING W REFLEX): HIV: NONREACTIVE

## 2011-04-28 ENCOUNTER — Ambulatory Visit (INDEPENDENT_AMBULATORY_CARE_PROVIDER_SITE_OTHER): Payer: 59 | Admitting: Family Medicine

## 2011-04-28 ENCOUNTER — Encounter: Payer: Self-pay | Admitting: Family Medicine

## 2011-04-28 VITALS — BP 141/82 | HR 97 | Temp 98.5°F | Wt 181.0 lb

## 2011-04-28 DIAGNOSIS — J069 Acute upper respiratory infection, unspecified: Secondary | ICD-10-CM

## 2011-04-28 MED ORDER — FLUTICASONE PROPIONATE 50 MCG/ACT NA SUSP: 2.0000 | Freq: Every day | NASAL | Status: DC

## 2011-04-28 MED ORDER — HYDROCHLOROTHIAZIDE 25 MG PO TABS: 25.0000 mg | ORAL_TABLET | Freq: Every day | ORAL | Status: DC

## 2011-04-28 NOTE — Patient Instructions (Signed)

## 2011-04-28 NOTE — Progress Notes (Signed)
  Subjective:    Patient ID: Tammy Nicholson, female    DOB: 07/03/1970, 41 y.o.   MRN: [id number]  HPI Sinsusit congestion, runny nose, feeling hot and cold.  Sweats. HA. + cough. Thinks tonsils are swollen.  Mildly productive cough.  Used nyquail.  + sick contacts. bilat ear pressure.  Cough is keepin her up at night.    Review of Systems     Objective:   Physical Exam  Constitutional: She is oriented to person, place, and time. She appears well-developed and well-nourished.  HENT:  Head: Normocephalic and atraumatic.  Right Ear: External ear normal.  Left Ear: External ear normal.  Nose: Nose normal.  Mouth/Throat: Oropharynx is clear and moist.       TMs and canals are clear.   Eyes: Conjunctivae and EOM are normal. Pupils are equal, round, and reactive to light.  Neck: Neck supple. No thyromegaly present.  Cardiovascular: Normal rate, regular rhythm and normal heart sounds.   Pulmonary/Chest: Effort normal and breath sounds normal. She has no wheezes.  Lymphadenopathy:    She has no cervical adenopathy.  Neurological: She is alert and oriented to person, place, and time.  Skin: Skin is warm and dry.  Psychiatric: She has a normal mood and affect.          Assessment & Plan:  URI- Gave reassurance.  Given H.O on OTC treatments.  Wil give a work note as well. Can use OTC cough meds, try corcidan as well. Call if not better in one week.    Givne copy of notes from her OV from last year where we ordered a [place]. Evidently her insurance is dening the [place].

## 2011-04-29 ENCOUNTER — Telehealth: Payer: Self-pay | Admitting: Family Medicine

## 2011-04-29 NOTE — Telephone Encounter (Signed)
Please call patient and let her know that did go through her scan documents and there was a form from [facility] that we did complete. I printed that off for her to pick up a copy so that way she contributed to her insurance company since they claim they never received anything from our office. She is also welcome to talk to her office manager to see if she may be able to assist in the billing issue.

## 2011-05-13 ENCOUNTER — Other Ambulatory Visit: Payer: Self-pay | Admitting: *Deleted

## 2011-05-13 MED ORDER — METFORMIN HCL 500 MG PO TABS: 500.0000 mg | ORAL_TABLET | Freq: Every day | ORAL | Status: DC

## 2011-05-16 ENCOUNTER — Ambulatory Visit: Payer: 59 | Admitting: Family Medicine

## 2011-09-05 ENCOUNTER — Encounter: Payer: Self-pay | Admitting: Physician Assistant

## 2011-09-05 ENCOUNTER — Ambulatory Visit (INDEPENDENT_AMBULATORY_CARE_PROVIDER_SITE_OTHER): Payer: 59 | Admitting: Physician Assistant

## 2011-09-05 VITALS — BP 137/82 | Wt 177.0 lb

## 2011-09-05 DIAGNOSIS — Z3200 Encounter for pregnancy test, result unknown: Secondary | ICD-10-CM

## 2011-09-05 DIAGNOSIS — O10019 Pre-existing essential hypertension complicating pregnancy, unspecified trimester: Secondary | ICD-10-CM | POA: Insufficient documentation

## 2011-09-05 DIAGNOSIS — O09529 Supervision of elderly multigravida, unspecified trimester: Secondary | ICD-10-CM | POA: Insufficient documentation

## 2011-09-05 DIAGNOSIS — Z3201 Encounter for pregnancy test, result positive: Secondary | ICD-10-CM

## 2011-09-05 DIAGNOSIS — Z3480 Encounter for supervision of other normal pregnancy, unspecified trimester: Secondary | ICD-10-CM

## 2011-09-05 LAB — POCT URINE PREGNANCY: Preg Test, Ur: POSITIVE

## 2011-09-05 NOTE — Patient Instructions (Signed)
Pregnancy - First Trimester  During sexual intercourse, millions of sperm go into the vagina. Only 1 sperm will penetrate and fertilize the female egg while it is in the Fallopian tube. One week later, the fertilized egg implants into the wall of the uterus. An embryo begins to develop into a baby. At 6 to 8 weeks, the eyes and face are formed and the heartbeat can be seen on ultrasound. At the end of 12 weeks (first trimester), all the baby's organs are formed. Now that you are pregnant, you will want to do everything you can to have a healthy baby. Two of the most important things are to get good prenatal care and follow your caregiver's instructions. Prenatal care is all the medical care you receive before the baby's birth. It is given to prevent, find, and treat problems during the pregnancy and childbirth.  PRENATAL EXAMS   During prenatal visits, your weight, blood pressure and urine are checked. This is done to make sure you are healthy and progressing normally during the pregnancy.   A pregnant woman should gain 25 to 35 pounds during the pregnancy. However, if you are over weight or underweight, your caregiver will advise you regarding your weight.   Your caregiver will ask and answer questions for you.   Blood work, cervical cultures, other necessary tests and a Pap test are done during your prenatal exams. These tests are done to check on your health and the probable health of your baby. Tests are strongly recommended and done for HIV with your permission. This is the virus that causes AIDS. These tests are done because medications can be given to help prevent your baby from being born with this infection should you have been infected without knowing it. Blood work is also used to find out your blood type, previous infections and follow your blood levels (hemoglobin).   Low hemoglobin (anemia) is common during pregnancy. Iron and vitamins are given to help prevent this. Later in the pregnancy, blood  tests for diabetes will be done along with any other tests if any problems develop. You may need tests to make sure you and the baby are doing well.   You may need other tests to make sure you and the baby are doing well.  CHANGES DURING THE FIRST TRIMESTER (THE FIRST 3 MONTHS OF PREGNANCY)  Your body goes through many changes during pregnancy. They vary from person to person. Talk to your caregiver about changes you notice and are concerned about. Changes can include:   Your menstrual period stops.   The egg and sperm carry the genes that determine what you look like. Genes from you and your partner are forming a baby. The female genes determine whether the baby is a boy or a girl.   Your body increases in girth and you may feel bloated.   Feeling sick to your stomach (nauseous) and throwing up (vomiting). If the vomiting is uncontrollable, call your caregiver.   Your breasts will begin to enlarge and become tender.   Your nipples may stick out more and become darker.   The need to urinate more. Painful urination may mean you have a bladder infection.   Tiring easily.   Loss of appetite.   Cravings for certain kinds of food.   At first, you may gain or lose a couple of pounds.   You may have changes in your emotions from day to day (excited to be pregnant or concerned something may go wrong with   the pregnancy and baby).   You may have more vivid and strange dreams.  HOME CARE INSTRUCTIONS    It is very important to avoid all smoking, alcohol and un-prescribed drugs during your pregnancy. These affect the formation and growth of the baby. Avoid chemicals while pregnant to ensure the delivery of a healthy infant.   Start your prenatal visits by the 12th week of pregnancy. They are usually scheduled monthly at first, then more often in the last 2 months before delivery. Keep your caregiver's appointments. Follow your caregiver's instructions regarding medication use, blood and lab tests, exercise, and  diet.   During pregnancy, you are providing food for you and your baby. Eat regular, well-balanced meals. Choose foods such as meat, fish, milk and other low fat dairy products, vegetables, fruits, and whole-grain breads and cereals. Your caregiver will tell you of the ideal weight gain.   You can help morning sickness by keeping soda crackers at the bedside. Eat a couple before arising in the morning. You may want to use the crackers without salt on them.   Eating 4 to 5 small meals rather than 3 large meals a day also may help the nausea and vomiting.   Drinking liquids between meals instead of during meals also seems to help nausea and vomiting.   A physical sexual relationship may be continued throughout pregnancy if there are no other problems. Problems may be early (premature) leaking of amniotic fluid from the membranes, vaginal bleeding, or belly (abdominal) pain.   Exercise regularly if there are no restrictions. Check with your caregiver or physical therapist if you are unsure of the safety of some of your exercises. Greater weight gain will occur in the last 2 trimesters of pregnancy. Exercising will help:   Control your weight.   Keep you in shape.   Prepare you for labor and delivery.   Help you lose your pregnancy weight after you deliver your baby.   Wear a good support or jogging bra for breast tenderness during pregnancy. This may help if worn during sleep too.   Ask when prenatal classes are available. Begin classes when they are offered.   Do not use hot tubs, steam rooms or saunas.   Wear your seat belt when driving. This protects you and your baby if you are in an accident.   Avoid raw meat, uncooked cheese, cat litter boxes and soil used by cats throughout the pregnancy. These carry germs that can cause birth defects in the baby.   The first trimester is a good time to visit your dentist for your dental health. Getting your teeth cleaned is OK. Use a softer toothbrush and brush  gently during pregnancy.   Ask for help if you have financial, counseling or nutritional needs during pregnancy. Your caregiver will be able to offer counseling for these needs as well as refer you for other special needs.   Do not take any medications or herbs unless told by your caregiver.   Inform your caregiver if there is any mental or physical domestic violence.   Make a list of emergency phone numbers of family, friends, hospital, and police and fire departments.   Write down your questions. Take them to your prenatal visit.   Do not douche.   Do not cross your legs.   If you have to stand for long periods of time, rotate you feet or take small steps in a circle.   You may have more vaginal secretions that may   require a sanitary pad. Do not use tampons or scented sanitary pads.  MEDICATIONS AND DRUG USE IN PREGNANCY   Take prenatal vitamins as directed. The vitamin should contain 1 milligram of folic acid. Keep all vitamins out of reach of children. Only a couple vitamins or tablets containing iron may be fatal to a baby or young child when ingested.   Avoid use of all medications, including herbs, over-the-counter medications, not prescribed or suggested by your caregiver. Only take over-the-counter or prescription medicines for pain, discomfort, or fever as directed by your caregiver. Do not use aspirin, ibuprofen, or naproxen unless directed by your caregiver.   Let your caregiver also know about herbs you may be using.   Alcohol is related to a number of birth defects. This includes fetal alcohol syndrome. All alcohol, in any form, should be avoided completely. Smoking will cause low birth rate and premature babies.   Street or illegal drugs are very harmful to the baby. They are absolutely forbidden. A baby born to an addicted mother will be addicted at birth. The baby will go through the same withdrawal an adult does.   Let your caregiver know about any medications that you have to take  and for what reason you take them.  MISCARRIAGE IS COMMON DURING PREGNANCY  A miscarriage does not mean you did something wrong. It is not a reason to worry about getting pregnant again. Your caregiver will help you with questions you may have. If you have a miscarriage, you may need minor surgery.  SEEK MEDICAL CARE IF:   You have any concerns or worries during your pregnancy. It is better to call with your questions if you feel they cannot wait, rather than worry about them.  SEEK IMMEDIATE MEDICAL CARE IF:    An unexplained oral temperature above 102 F (38.9 C) develops, or as your caregiver suggests.   You have leaking of fluid from the vagina (birth canal). If leaking membranes are suspected, take your temperature and inform your caregiver of this when you call.   There is vaginal spotting or bleeding. Notify your caregiver of the amount and how many pads are used.   You develop a bad smelling vaginal discharge with a change in the color.   You continue to feel sick to your stomach (nauseated) and have no relief from remedies suggested. You vomit blood or coffee ground-like materials.   You lose more than 2 pounds of weight in 1 week.   You gain more than 2 pounds of weight in 1 week and you notice swelling of your face, hands, feet, or legs.   You gain 5 pounds or more in 1 week (even if you do not have swelling of your hands, face, legs, or feet).   You get exposed to German measles and have never had them.   You are exposed to fifth disease or chickenpox.   You develop belly (abdominal) pain. Round ligament discomfort is a common non-cancerous (benign) cause of abdominal pain in pregnancy. Your caregiver still must evaluate this.   You develop headache, fever, diarrhea, pain with urination, or shortness of breath.   You fall or are in a car accident or have any kind of trauma.   There is mental or physical violence in your home.  Document Released: 03/08/2001 Document Revised: 03/03/2011  Document Reviewed: 09/09/2008  Exit[employer] Patient Information 2012 [employer].

## 2011-09-05 NOTE — Progress Notes (Signed)
Subjective:    Tammy Nicholson is a G1P0000 [redacted]w[redacted]d being seen today for her first obstetrical visit.  Her obstetrical history is significant for advanced maternal age, obesity and Chronic HT, Insulin resisitance (on Metformin). Patient undecided intend to breast feed. Pregnancy history fully reviewed.  Patient reports Uncertain dates related to PCOS. Reports LMP 07/13/11. Positive ovulation kit and neg UPT on 08/11/11. Positive home UPT ~ 2 weeks later. States mild cramping and light spotting 1-2 weeks ago.  Filed Vitals:   09/05/11 0851  BP: 137/82  Weight: 177 lb (80.287 kg)    HISTORY: OB History    Grav Para Term Preterm Abortions TAB SAB Ect Mult Living   1 0 0 0 0 0 0 0 0 0      # Outc Date GA Lbr Len/2nd Wgt Sex Del Anes PTL Lv   1 CUR              Past Medical History  Diagnosis Date  . Polycystic ovarian disease   . Insulin resistance syndrome   . Polycystic disease, ovaries   . Allergy     seasonal  . Hypertension    Past Surgical History  Procedure Date  . Dilation and curettage of uterus   . Deviated septum repair   . Breast reduction surgery 08/2009    Dr. Etter Sjogren  . Heel spur excision 08/2009    Dr. Yates Decamp    Family History  Problem Relation Age of Onset  . Diabetes Father   . Hyperlipidemia Father   . Hypertension Father   . Coronary artery disease Father   . Cancer Maternal Aunt     breast  . Diabetes Other   . Diabetes Other      Exam    Uterus:     Pelvic Exam:    Perineum: No Hemorrhoids, Normal Perineum   Vulva: normal   Vagina:  normal mucosa   Cervix: no cervical motion tenderness, uterus non enlarged   Adnexa: normal adnexa and no mass, fullness, tenderness   Bony Pelvis: gynecoid  System: Breast:  normal appearance, no masses or tenderness   Skin: normal coloration and turgor, no rashes    Neurologic: oriented, normal, negative   Extremities: normal strength, tone, and muscle mass, no deformities   HEENT thyroid without  masses   Mouth/Teeth mucous membranes moist, pharynx normal without lesions   Neck supple and no masses   Cardiovascular: regular rate and rhythm, no murmurs or gallops   Respiratory:  appears well, vitals normal, no respiratory distress, acyanotic, normal RR, ear and throat exam is normal, neck free of mass or lymphadenopathy, chest clear, no wheezing, crepitations, rhonchi, normal symmetric air entry   Abdomen: soft, non-tender; bowel sounds normal; no masses,  no organomegaly   Urinary: urethral meatus normal and bladder fullness present   [place]: unable to visulize preg on [place] today   Assessment:    Pregnancy: G1P0000 Positive UPT, unconfirmed by [place] 1. Benign essential HTN, chronic, antepartum  Obstetric panel, Culture, OB Urine, Culture, OB Urine, GC/chlamydia probe amp, urine, HIV Antibody ( Reflex), B-HCG Quant, US OB Comp Less 14 Wks, US OB Transvaginal  2. AMA (advanced maternal age) multigravida 35+  Obstetric panel, Culture, OB Urine, Culture, OB Urine, GC/chlamydia probe amp, urine, HIV Antibody ( Reflex), B-HCG Quant, US OB Comp Less 14 Wks, US OB Transvaginal  3. Visit for confirmation of pregnancy test result with physical exam  POCT urine pregnancy, Culture, OB Urine, GC/chlamydia probe amp,  urine, HIV Antibody ( Reflex), B-HCG Quant, US OB Comp Less 14 Wks, US OB Transvaginal         Plan:  Discontinue HCTZ, will monitor BP closely for need to add medication Will obtain Quant today and scheduled for FU [place] to confirm IUP, viability and dating.    Initial labs drawn. Prenatal vitamins. Problem list reviewed and updated. Genetic Screening discussed Integrated Screen [facility]: Discussed.   Emberley Kral E. 09/05/2011

## 2011-09-05 NOTE — Progress Notes (Signed)
Patient is here for new OB.  Positive pregnancy test in office today.

## 2011-09-06 LAB — OBSTETRIC PANEL
Antibody Screen: NEGATIVE
Basophils Relative: 0 % (ref 0–1)
HCT: 43.2 % (ref 36.0–46.0)
Hemoglobin: 14.6 g/dL (ref 12.0–15.0)
MCH: 31 pg (ref 26.0–34.0)
MCHC: 33.8 g/dL (ref 30.0–36.0)
Monocytes Absolute: 0.5 10*3/uL (ref 0.1–1.0)
Monocytes Relative: 8 % (ref 3–12)
Neutro Abs: 3.8 10*3/uL (ref 1.7–7.7)
Rh Type: POSITIVE

## 2011-09-06 LAB — GC/CHLAMYDIA PROBE AMP, URINE: Chlamydia, Swab/Urine, PCR: NEGATIVE

## 2011-09-09 ENCOUNTER — Telehealth: Payer: Self-pay | Admitting: *Deleted

## 2011-09-09 NOTE — Telephone Encounter (Signed)
Tammy Nicholson called Tammy Nicholson to let her know that she had cancelled her appt for her early OB scan due to cost.  She states that she feels it is too early to incur the cost for a scan and she was happy with the bedside U/S that was done in this office.  She only wants to have the anatomy scan done.

## 2011-09-10 ENCOUNTER — Other Ambulatory Visit (HOSPITAL_BASED_OUTPATIENT_CLINIC_OR_DEPARTMENT_OTHER): Payer: 59

## 2011-09-11 ENCOUNTER — Other Ambulatory Visit (HOSPITAL_BASED_OUTPATIENT_CLINIC_OR_DEPARTMENT_OTHER): Payer: 59

## 2011-09-13 ENCOUNTER — Encounter: Payer: Self-pay | Admitting: *Deleted

## 2011-10-03 ENCOUNTER — Ambulatory Visit (HOSPITAL_COMMUNITY)
Admission: RE | Admit: 2011-10-03 | Discharge: 2011-10-03 | Disposition: A | Discharge status: Home / Self Care | Payer: 59 | Source: Ambulatory Visit | Attending: Family | Admitting: Family

## 2011-10-03 ENCOUNTER — Ambulatory Visit (INDEPENDENT_AMBULATORY_CARE_PROVIDER_SITE_OTHER): Payer: 59 | Admitting: Family

## 2011-10-03 ENCOUNTER — Ambulatory Visit (HOSPITAL_COMMUNITY): Payer: 59

## 2011-10-03 ENCOUNTER — Ambulatory Visit (INDEPENDENT_AMBULATORY_CARE_PROVIDER_SITE_OTHER): Payer: 59 | Admitting: Obstetrics & Gynecology

## 2011-10-03 ENCOUNTER — Other Ambulatory Visit: Payer: Self-pay | Admitting: Family

## 2011-10-03 VITALS — BP 128/77 | Wt 175.0 lb

## 2011-10-03 DIAGNOSIS — O0990 Supervision of high risk pregnancy, unspecified, unspecified trimester: Secondary | ICD-10-CM

## 2011-10-03 DIAGNOSIS — O09529 Supervision of elderly multigravida, unspecified trimester: Secondary | ICD-10-CM

## 2011-10-03 DIAGNOSIS — O99810 Abnormal glucose complicating pregnancy: Secondary | ICD-10-CM

## 2011-10-03 DIAGNOSIS — O3680X0 Pregnancy with inconclusive fetal viability, not applicable or unspecified: Secondary | ICD-10-CM | POA: Insufficient documentation

## 2011-10-03 DIAGNOSIS — O10019 Pre-existing essential hypertension complicating pregnancy, unspecified trimester: Secondary | ICD-10-CM | POA: Insufficient documentation

## 2011-10-03 DIAGNOSIS — Z3200 Encounter for pregnancy test, result unknown: Secondary | ICD-10-CM

## 2011-10-03 DIAGNOSIS — O09519 Supervision of elderly primigravida, unspecified trimester: Secondary | ICD-10-CM | POA: Insufficient documentation

## 2011-10-03 NOTE — Progress Notes (Signed)
Unable to auscultate or visualize fetal heart rate today; pt did not go to ultrasound appt thought it was "too early"; will reschedule to assess viability.  Pt desires nutritional counseling.  Sent to [facility] for counseling and ultrasound at 3:30 today.  Once viability confirmed will obtain baseline PIH labs.

## 2011-10-03 NOTE — Progress Notes (Signed)
Diabetes Education:  Completed a review of diet and monitoring.  Has history of PCOS and has monitored in the past.  Has a meter of undetermined age.  Provided an Accu Chek Smartview meter kit.  Will need prescription for Accu-Chek SmartView stips and Accu-Chek Fast-Clix lancets called in to the [facility] in [place]. On return demonstration at 12:25 PM glucose was 103 mg/dl.  Instructed to monitor fasting and 2 her post meal and to record and bring meter and log book to all clinic appointments.  Maggie Monserrat Vidaurri, RN, RD, CDE.

## 2011-10-03 NOTE — Progress Notes (Signed)
Routine prenatal check, having small amount of spotting immediately following intercourse.

## 2011-10-04 ENCOUNTER — Telehealth: Payer: Self-pay | Admitting: *Deleted

## 2011-10-04 ENCOUNTER — Ambulatory Visit (HOSPITAL_COMMUNITY): Payer: 59

## 2011-10-04 DIAGNOSIS — O200 Threatened abortion: Secondary | ICD-10-CM

## 2011-10-04 NOTE — Telephone Encounter (Signed)
Pt notified of  Failed IUP shown on TVU and will schedule a f/u scan in 7 days.  Pt states that she is already and has been spotting.  Pt is O pos and will not need Rhogam.  Will schedule u/s and f/u next week.  Instructed pt that if she bleeds through a pad every 15 minutes to go to the MAU.

## 2011-10-08 ENCOUNTER — Telehealth: Payer: Self-pay | Admitting: Family Medicine

## 2011-10-08 NOTE — Telephone Encounter (Signed)
Pt. Presented there with bleeding and had u/s which showed no IUP and thickened heterogeneous material without flow.  Probable complete AB.  She will f/u in the office on Monday.

## 2011-10-10 ENCOUNTER — Ambulatory Visit (HOSPITAL_COMMUNITY): Admission: RE | Admit: 2011-10-10 | Payer: 59 | Source: Ambulatory Visit

## 2011-10-10 ENCOUNTER — Ambulatory Visit (HOSPITAL_COMMUNITY): Payer: 59

## 2011-10-11 ENCOUNTER — Other Ambulatory Visit: Payer: Self-pay | Admitting: Family Medicine

## 2011-10-11 ENCOUNTER — Encounter: Payer: 59 | Admitting: Obstetrics & Gynecology

## 2011-10-11 ENCOUNTER — Ambulatory Visit: Payer: 59 | Admitting: Obstetrics & Gynecology

## 2011-10-11 VITALS — BP 146/85 | Wt 177.0 lb

## 2011-10-11 DIAGNOSIS — O200 Threatened abortion: Secondary | ICD-10-CM

## 2011-10-11 DIAGNOSIS — O039 Complete or unspecified spontaneous abortion without complication: Secondary | ICD-10-CM

## 2011-10-11 MED ORDER — MISOPROSTOL 200 MCG PO TABS: ORAL_TABLET | ORAL | Status: DC

## 2011-10-11 NOTE — Progress Notes (Signed)
[id number] 

## 2011-10-11 NOTE — Progress Notes (Signed)
Pt having some bleeding and cramping today.  Pt recently went to [facility] facility and diagnosed with complete miscarriage.  Speculum exam--moderate amt of blood.  Nothing in canal or LUS with digital exam or ring forcept.  Bedside US shows lining 15 mm with no obvious retained POC.  Beta HCG today and cytotec 800 per vagina.  If till bleeding tomorrow heavy, will rpt [place] in radiology.

## 2011-10-12 LAB — HCG, QUANTITATIVE, PREGNANCY: hCG, Beta Chain, Quant, S: 358.1 m[IU]/mL

## 2011-10-25 ENCOUNTER — Ambulatory Visit (INDEPENDENT_AMBULATORY_CARE_PROVIDER_SITE_OTHER): Payer: 59 | Admitting: Obstetrics & Gynecology

## 2011-10-25 ENCOUNTER — Encounter: Payer: Self-pay | Admitting: Obstetrics & Gynecology

## 2011-10-25 VITALS — BP 119/85 | HR 84 | Resp 16 | Wt 177.0 lb

## 2011-10-25 DIAGNOSIS — O039 Complete or unspecified spontaneous abortion without complication: Secondary | ICD-10-CM

## 2011-10-25 MED ORDER — MEDROXYPROGESTERONE ACETATE 10 MG PO TABS: 10.0000 mg | ORAL_TABLET | Freq: Every day | ORAL | Status: DC

## 2011-10-25 MED ORDER — CLOMIPHENE CITRATE 50 MG PO TABS: ORAL_TABLET | ORAL | Status: DC

## 2011-10-25 NOTE — Progress Notes (Signed)
  Subjective:    Patient ID: Tammy Nicholson, female    DOB: Jan 16, 1971, 41 y.o.   MRN: [id number]  HPI She is here for follow up after a spontaneous AB 2 weeks ago. She has no complaints. She conceived with 1 cycle of clomid (She is on metformin daily also).   Review of Systems     Objective:   Physical Exam        Assessment & Plan:  S/p sAB- doing well I will recheck her QBHCG in 4 weeks.  She wants to conceive again ASAP. I have e-prescribed provera and another cycle of clomid (50mg ). She is advised to use condoms for at least the next 2 months.

## 2011-11-07 ENCOUNTER — Other Ambulatory Visit: Payer: Self-pay | Admitting: *Deleted

## 2011-11-07 MED ORDER — METFORMIN HCL 500 MG PO TABS: 500.0000 mg | ORAL_TABLET | Freq: Every day | ORAL | Status: DC

## 2011-11-24 ENCOUNTER — Other Ambulatory Visit: Payer: 59

## 2011-11-24 DIAGNOSIS — O021 Missed abortion: Secondary | ICD-10-CM

## 2011-11-24 NOTE — Progress Notes (Signed)
Patient is here for a lab order for HCG quant.

## 2011-11-25 LAB — HCG, QUANTITATIVE, PREGNANCY: hCG, Beta Chain, Quant, S: <2 m[IU]/mL

## 2012-01-20 ENCOUNTER — Other Ambulatory Visit: Payer: Self-pay | Admitting: *Deleted

## 2012-01-20 MED ORDER — HYDROCHLOROTHIAZIDE 25 MG PO TABS: 25.0000 mg | ORAL_TABLET | Freq: Every day | ORAL | Status: DC

## 2012-02-22 ENCOUNTER — Telehealth: Payer: Self-pay | Admitting: *Deleted

## 2012-02-22 ENCOUNTER — Ambulatory Visit (INDEPENDENT_AMBULATORY_CARE_PROVIDER_SITE_OTHER): Payer: 59 | Admitting: Family Medicine

## 2012-02-22 VITALS — BP 127/93 | HR 86 | Wt 186.0 lb

## 2012-02-22 DIAGNOSIS — H8110 Benign paroxysmal vertigo, unspecified ear: Secondary | ICD-10-CM

## 2012-02-22 DIAGNOSIS — R42 Dizziness and giddiness: Secondary | ICD-10-CM

## 2012-02-22 MED ORDER — MECLIZINE HCL 25 MG PO TABS: 25.0000 mg | ORAL_TABLET | Freq: Three times a day (TID) | ORAL | Status: DC | PRN

## 2012-02-22 NOTE — Telephone Encounter (Signed)
Placed on Dr. Ivan Anchors schedule to be seen today.

## 2012-02-22 NOTE — Patient Instructions (Signed)
Benign Positional Vertigo  Vertigo means you feel like you or your surroundings are moving when they are not. Benign positional vertigo is the most common form of vertigo. Benign means that the cause of your condition is not serious. Benign positional vertigo is more common in older adults.  CAUSES   Benign positional vertigo is the result of an upset in the labyrinth system. This is an area in the middle ear that helps control your balance. This may be caused by a viral infection, head injury, or repetitive motion. However, often no specific cause is found.  SYMPTOMS   Symptoms of benign positional vertigo occur when you move your head or eyes in different directions. Some of the symptoms may include:  · Loss of balance and falls.  · Vomiting.  · Blurred vision.  · Dizziness.  · Nausea.  · Involuntary eye movements (nystagmus).  DIAGNOSIS   Benign positional vertigo is usually diagnosed by physical exam. If the specific cause of your benign positional vertigo is unknown, your caregiver may perform imaging tests, such as magnetic resonance imaging (MRI) or computed tomography (CT).  TREATMENT   Your caregiver may recommend movements or procedures to correct the benign positional vertigo. Medicines such as meclizine, benzodiazepines, and medicines for nausea may be used to treat your symptoms. In rare cases, if your symptoms are caused by certain conditions that affect the inner ear, you may need surgery.  HOME CARE INSTRUCTIONS   · Follow your caregiver's instructions.  · Move slowly. Do not make sudden body or head movements.  · Avoid driving.  · Avoid operating heavy machinery.  · Avoid performing any tasks that would be dangerous to you or others during a vertigo episode.  · Drink enough fluids to keep your urine clear or pale yellow.  SEEK IMMEDIATE MEDICAL CARE IF:   · You develop problems with walking, weakness, numbness, or using your arms, hands, or legs.  · You have difficulty speaking.  · You develop  severe headaches.  · Your nausea or vomiting continues or gets worse.  · You develop visual changes.  · Your family or friends notice any behavioral changes.  · Your condition gets worse.  · You have a fever.  · You develop a stiff neck or sensitivity to light.  MAKE SURE YOU:   · Understand these instructions.  · Will watch your condition.  · Will get help right away if you are not doing well or get worse.  Document Released: 12/20/2005 Document Revised: 06/06/2011 Document Reviewed: 12/02/2010  [employer]® Patient Information ©2013 [employer].
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
## 2012-03-02 ENCOUNTER — Encounter: Payer: Self-pay | Admitting: Family Medicine

## 2012-03-02 ENCOUNTER — Ambulatory Visit (INDEPENDENT_AMBULATORY_CARE_PROVIDER_SITE_OTHER): Payer: 59 | Admitting: Family Medicine

## 2012-03-02 VITALS — BP 136/88 | HR 90 | Ht 63.5 in | Wt 184.0 lb

## 2012-03-02 DIAGNOSIS — Z23 Encounter for immunization: Secondary | ICD-10-CM

## 2012-03-02 DIAGNOSIS — I10 Essential (primary) hypertension: Secondary | ICD-10-CM

## 2012-03-02 DIAGNOSIS — Z0000 Encounter for general adult medical examination without abnormal findings: Secondary | ICD-10-CM

## 2012-03-02 DIAGNOSIS — E8881 Metabolic syndrome: Secondary | ICD-10-CM

## 2012-03-02 LAB — POCT GLYCOSYLATED HEMOGLOBIN (HGB A1C): Hemoglobin A1C: 6.4

## 2012-03-02 MED ORDER — METHYLDOPA 250 MG PO TABS: 250.0000 mg | ORAL_TABLET | Freq: Three times a day (TID) | ORAL | Status: DC

## 2012-03-02 MED ORDER — AMBULATORY NON FORMULARY MEDICATION: Status: AC

## 2012-03-02 NOTE — Progress Notes (Signed)
Subjective:     Tammy Nicholson is a 41 y.o. female and is here for a comprehensive physical exam. The patient reports problems - lost pregnancy over the summer.  Still rying to get pregnant. . Saw Dr. Ivan Anchors last week for vertigo. Uses the meclizine prn.  He has been doing the exercises and it has gotten better.  BP has been better today.   History   Social History  . Marital Status: Single    Spouse Name: N/A    Number of Children: N/A  . Years of Education: N/A   Occupational History  . administrative asst    Social History Main Topics  . Smoking status: Never Smoker   . Smokeless tobacco: Never Used  . Alcohol Use: No  . Drug Use: No  . Sexually Active: Yes -- Female partner(s)   Other Topics Concern  . Not on file   Social History Narrative  . No narrative on file   Health Maintenance  Topic Date Due  . Hemoglobin A1c  07/26/69  . Pneumococcal Polysaccharide Vaccine (#1) 07/10/1971  . Foot Exam  07/10/1979  . Urine Microalbumin  07/10/1979  . Influenza Vaccine  11/27/2011  . Ophthalmology Exam  02/15/2013  . Pap Smear  09/23/2013  . Tetanus/tdap  12/26/2020    The following portions of the patient's history were reviewed and updated as appropriate: allergies, current medications, past family history, past medical history, past social history, past surgical history and problem list.  Review of Systems A comprehensive review of systems was negative.   Objective:    BP 136/88  Pulse 90  Ht 5' 3.5" (1.613 m)  Wt 184 lb (83.462 kg)  BMI 32.08 kg/m2  LMP 07/10/2011 General appearance: alert, cooperative and appears stated age Head: Normocephalic, without obvious abnormality, atraumatic Eyes: conj clear, EOMI, PEERLA Ears: normal TM's and external ear canals both ears Nose: Nares normal. Septum midline. Mucosa normal. No drainage or sinus tenderness. Throat: lips, mucosa, and tongue normal; teeth and gums normal Neck: no adenopathy, no carotid bruit, no JVD,  supple, symmetrical, trachea midline and thyroid not enlarged, symmetric, no tenderness/mass/nodules Back: symmetric, no curvature. ROM normal. No CVA tenderness. Lungs: clear to auscultation bilaterally Breasts: normal appearance, no masses or tenderness, scars healing well wiht soe hyperpigmentation Heart: regular rate and rhythm, S1, S2 normal, no murmur, click, rub or gallop Abdomen: soft, non-tender; bowel sounds normal; no masses,  no organomegaly Pelvic: deferred and not performed Extremities: extremities normal, atraumatic, no cyanosis or edema Pulses: 2+ and symmetric Skin: Skin color, texture, turgor normal. No rashes or lesions Lymph nodes: Cervical, supraclavicular, and axillary nodes normal. Neurologic: Grossly normal    Assessment:    Healthy female exam.      Plan:     See After Visit Summary for Counseling Recommendations  Keep up a regular exercise program and make sure you are eating a healthy diet Try to eat 4 servings of dairy a day, or if you are lactose intolerant take a calcium with vitamin D daily.  Your vaccines are up to date.   IFG - hgb A1C is up to day. Work on diet and exercise.    Flu vaccine given.   Benign positional vertigo-I. do think she is improving slightly. Encouraged her again to continue with the exercises, use the meclizine when needed, and hopefully she will get better in the next 2-3 weeks.  Hypertension-she is controlled today but it is borderline. Her home blood pressures have been running in  the upper 130s over 90. I will add Aldomet 250 mg 3 times a day to her current regimen of hydrochlorothiazide since she is actively trying to get pregnant. F/U in 2 months.   She has a family history of breast cancer in her mother, and maternal and. She also has a history of ovarian cancer in her paternal grandmother and her paternal aunt. Thinks his a great candidate for BRCA testing we discussed this today. Her aunt was 9 years old and was  premenopausal. Says she's a good candidate. We collected a sample today and we will send it off. I asked him to her that they will check with her insurance and contact her person she knows what her portion is that she will be responsible for.

## 2012-03-02 NOTE — Patient Instructions (Addendum)
Keep up a regular exercise program and make sure you are eating a healthy diet Try to eat 4 servings of dairy a day, or if you are lactose intolerant take a calcium with vitamin D daily.  Your vaccines are up to date.   

## 2012-03-05 LAB — COMPLETE METABOLIC PANEL WITH GFR
ALT: 53 U/L — ABNORMAL HIGH (ref 0–35)
AST: 30 U/L (ref 0–37)
BUN: 16 mg/dL (ref 6–23)
CO2: 28 mEq/L (ref 19–32)
Calcium: 9.6 mg/dL (ref 8.4–10.5)
Chloride: 100 mEq/L (ref 96–112)
Creat: 0.54 mg/dL (ref 0.50–1.10)
GFR, Est African American: >89 mL/min
Total Bilirubin: 1 mg/dL (ref 0.3–1.2)

## 2012-03-05 LAB — LIPID PANEL
Cholesterol: 163 mg/dL (ref 0–200)
HDL: 34 mg/dL — ABNORMAL LOW (ref >39)
Triglycerides: 112 mg/dL (ref <150)

## 2012-03-14 ENCOUNTER — Telehealth: Payer: Self-pay | Admitting: Family Medicine

## 2012-03-14 NOTE — Telephone Encounter (Signed)
Call patient: Please let her know I did get the results of her brachial testing. There is no mutation detected which is fantastic. It still means that we need to do screening mammograms yearly, on her but it does lower her risk which is fantastic.

## 2012-03-29 ENCOUNTER — Other Ambulatory Visit: Payer: Self-pay | Admitting: *Deleted

## 2012-03-29 DIAGNOSIS — R748 Abnormal levels of other serum enzymes: Secondary | ICD-10-CM

## 2012-03-29 NOTE — Telephone Encounter (Signed)
Patient advised.

## 2012-03-30 LAB — BASIC METABOLIC PANEL WITH GFR
BUN: 13 mg/dL (ref 6–23)
CO2: 27 mEq/L (ref 19–32)
Calcium: 9.7 mg/dL (ref 8.4–10.5)
Chloride: 100 mEq/L (ref 96–112)
Creat: 0.58 mg/dL (ref 0.50–1.10)
Glucose, Bld: 115 mg/dL — ABNORMAL HIGH (ref 70–99)

## 2012-03-30 NOTE — Progress Notes (Signed)
Quick Note:  All labs are normal. ______ 

## 2012-04-13 ENCOUNTER — Other Ambulatory Visit: Payer: Self-pay | Admitting: *Deleted

## 2012-04-13 MED ORDER — METFORMIN HCL 500 MG PO TABS: 500.0000 mg | ORAL_TABLET | Freq: Every day | ORAL | Status: DC

## 2012-04-19 ENCOUNTER — Encounter: Payer: Self-pay | Admitting: Obstetrics & Gynecology

## 2012-04-19 DIAGNOSIS — Z803 Family history of malignant neoplasm of breast: Secondary | ICD-10-CM | POA: Insufficient documentation

## 2012-04-23 ENCOUNTER — Telehealth: Payer: Self-pay | Admitting: Family Medicine

## 2012-04-23 NOTE — Telephone Encounter (Signed)
Pt is calling due to needing PA for metformin [phone]. Stated that the Rx was written for name brand only and she cannot afford.

## 2012-04-24 ENCOUNTER — Telehealth: Payer: Self-pay | Admitting: *Deleted

## 2012-04-24 NOTE — Telephone Encounter (Signed)
Pt will need to call to give consent to ok for generic [phone].Loralee Pacas [place]

## 2012-04-30 ENCOUNTER — Telehealth: Payer: Self-pay | Admitting: *Deleted

## 2012-04-30 NOTE — Progress Notes (Signed)
Patient is here to receive DM education only. Please refer to note by Margaret May, CDE. 

## 2012-04-30 NOTE — Telephone Encounter (Signed)
Pt called and stated that she has started to spot today.  If she has not had a full flow by Friday will do UPT and if neg will start her Provera and may need to increase her CC.

## 2012-05-01 IMAGING — CR DG SHOULDER 2+V*R*
3 series · 3 of 3 positions shown · non-contrast
Comparison: None

CLINICAL DATA: Right shoulder pain.

RIGHT SHOULDER - 2+ VIEW

[view not recorded (1 of 3)]
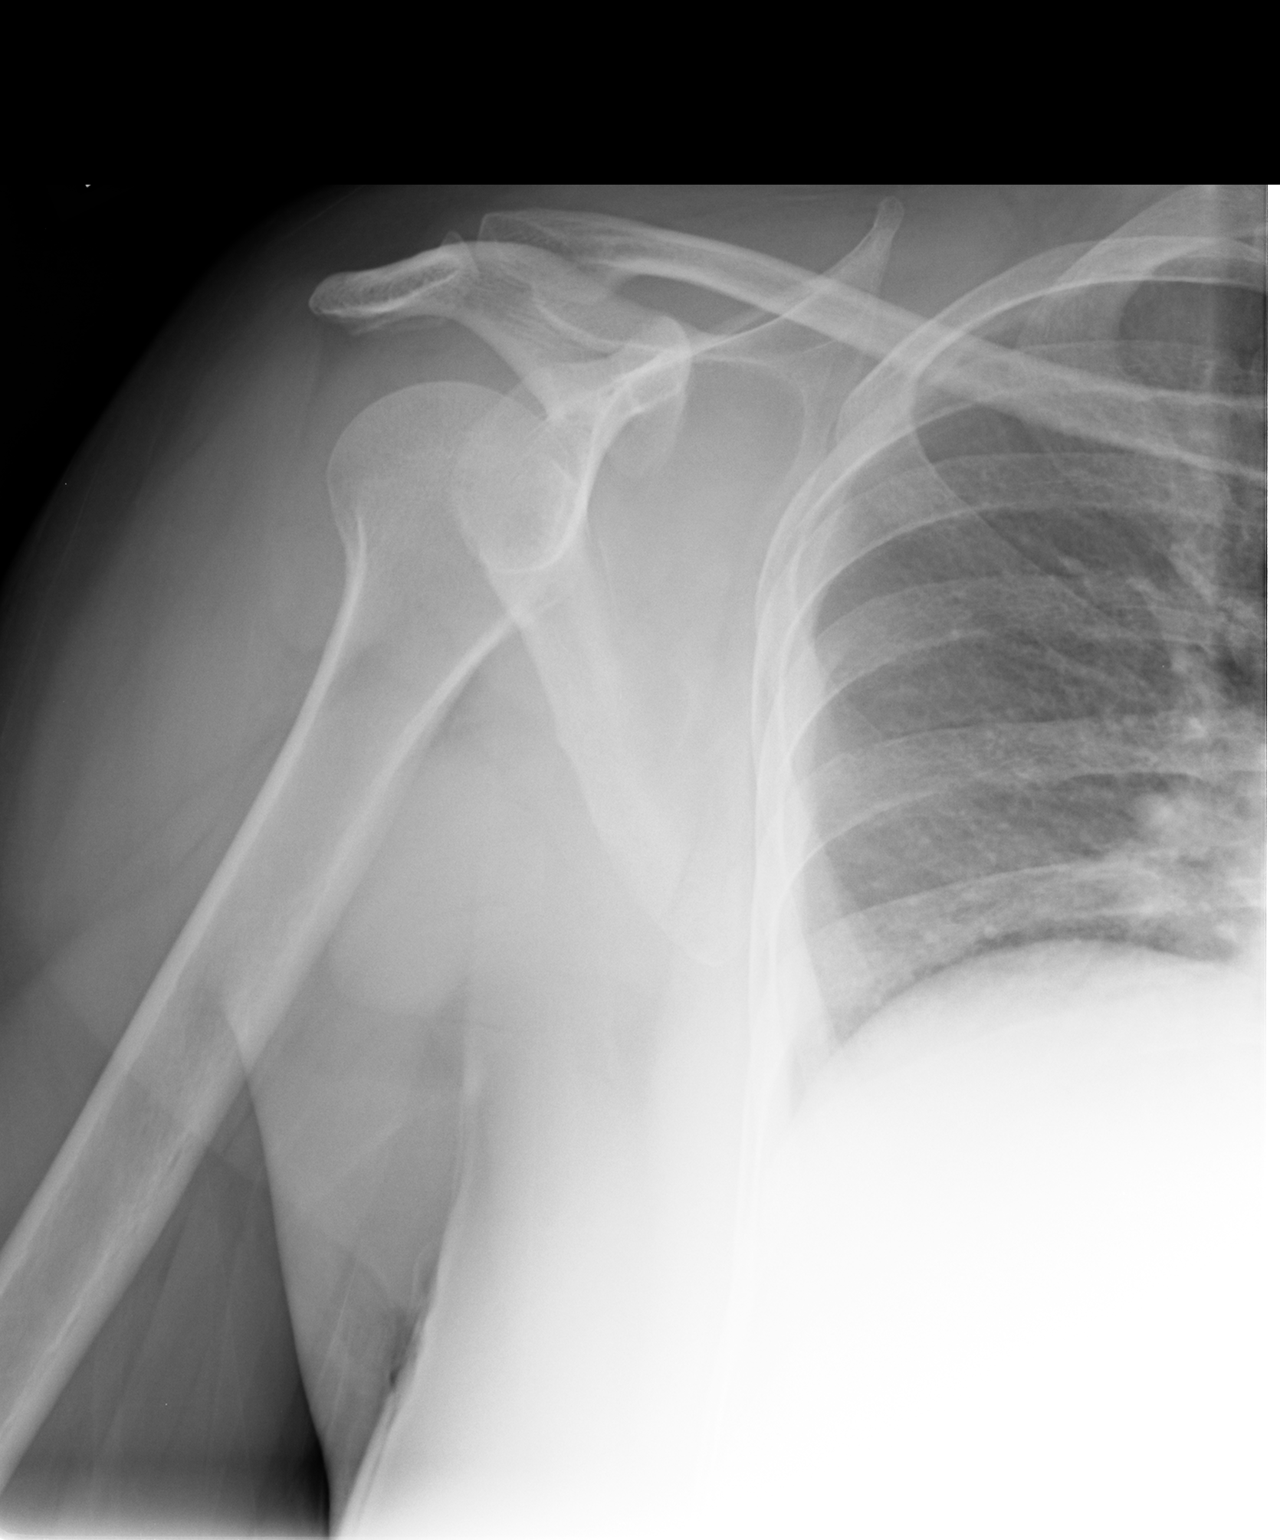

[view not recorded (2 of 3)]
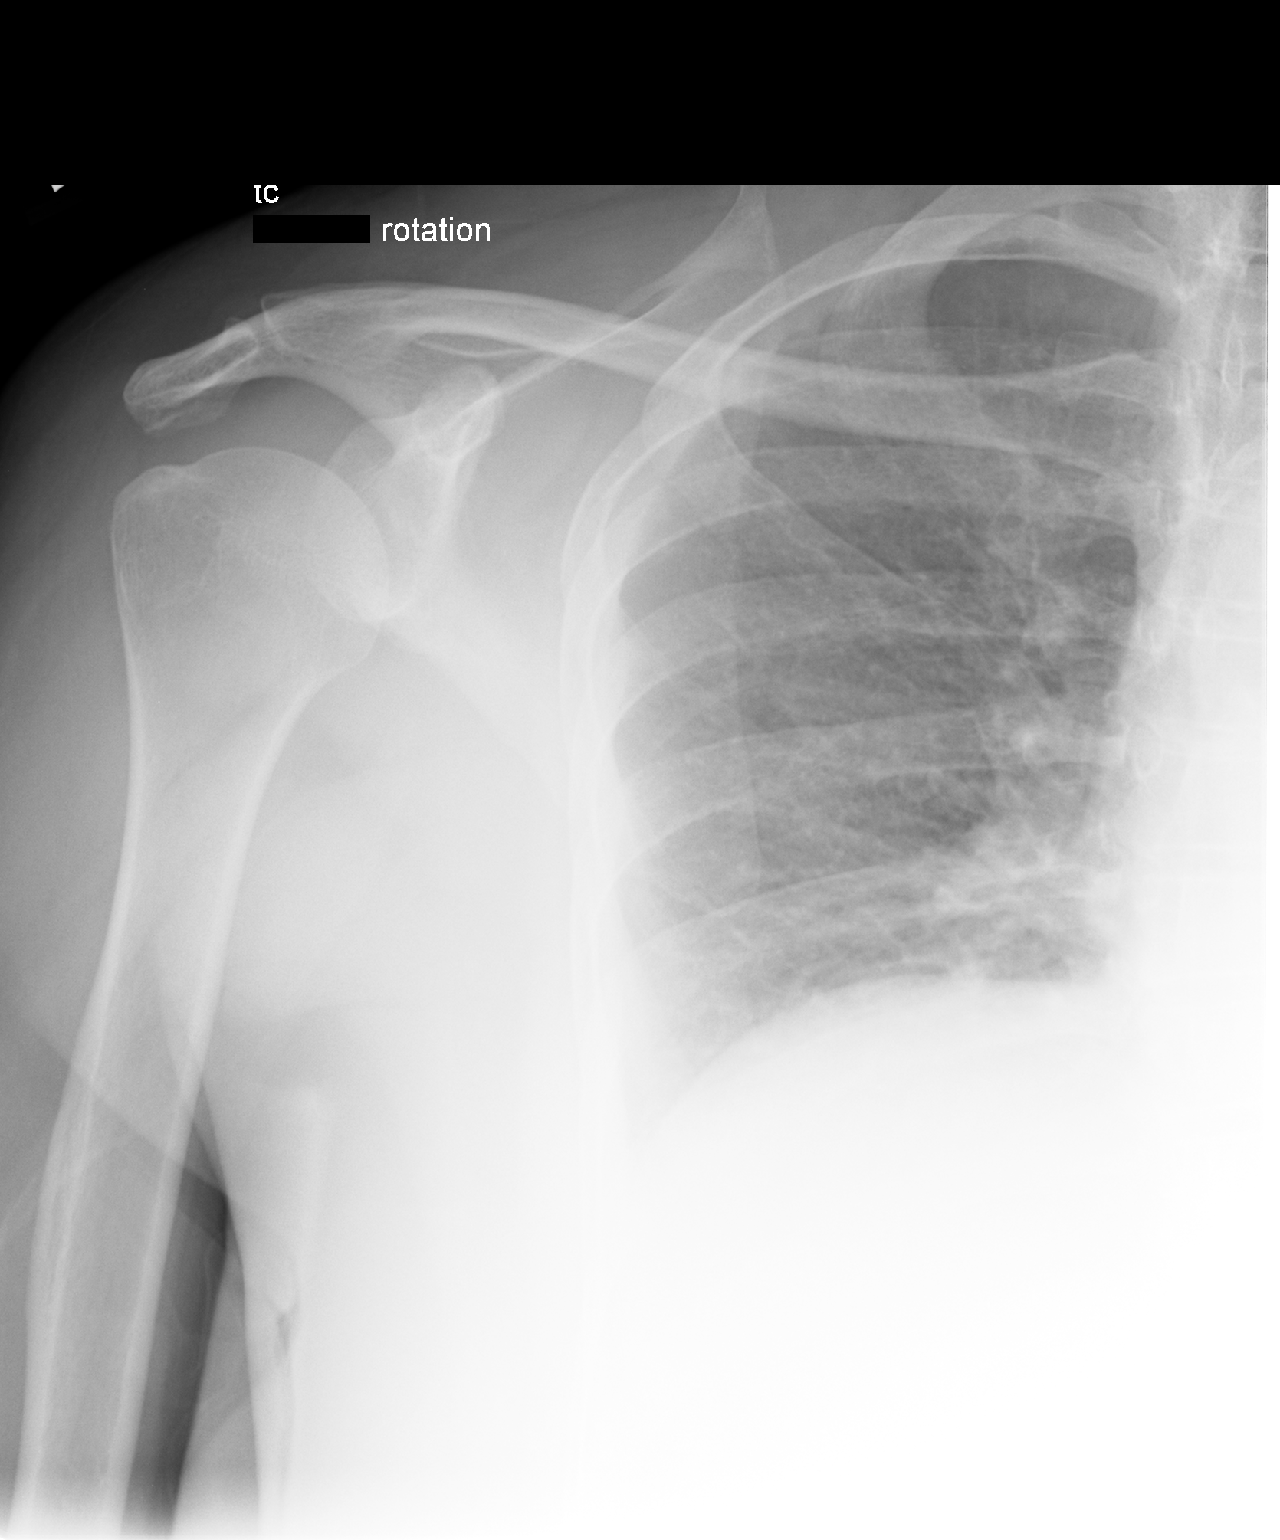

[view not recorded (3 of 3)]
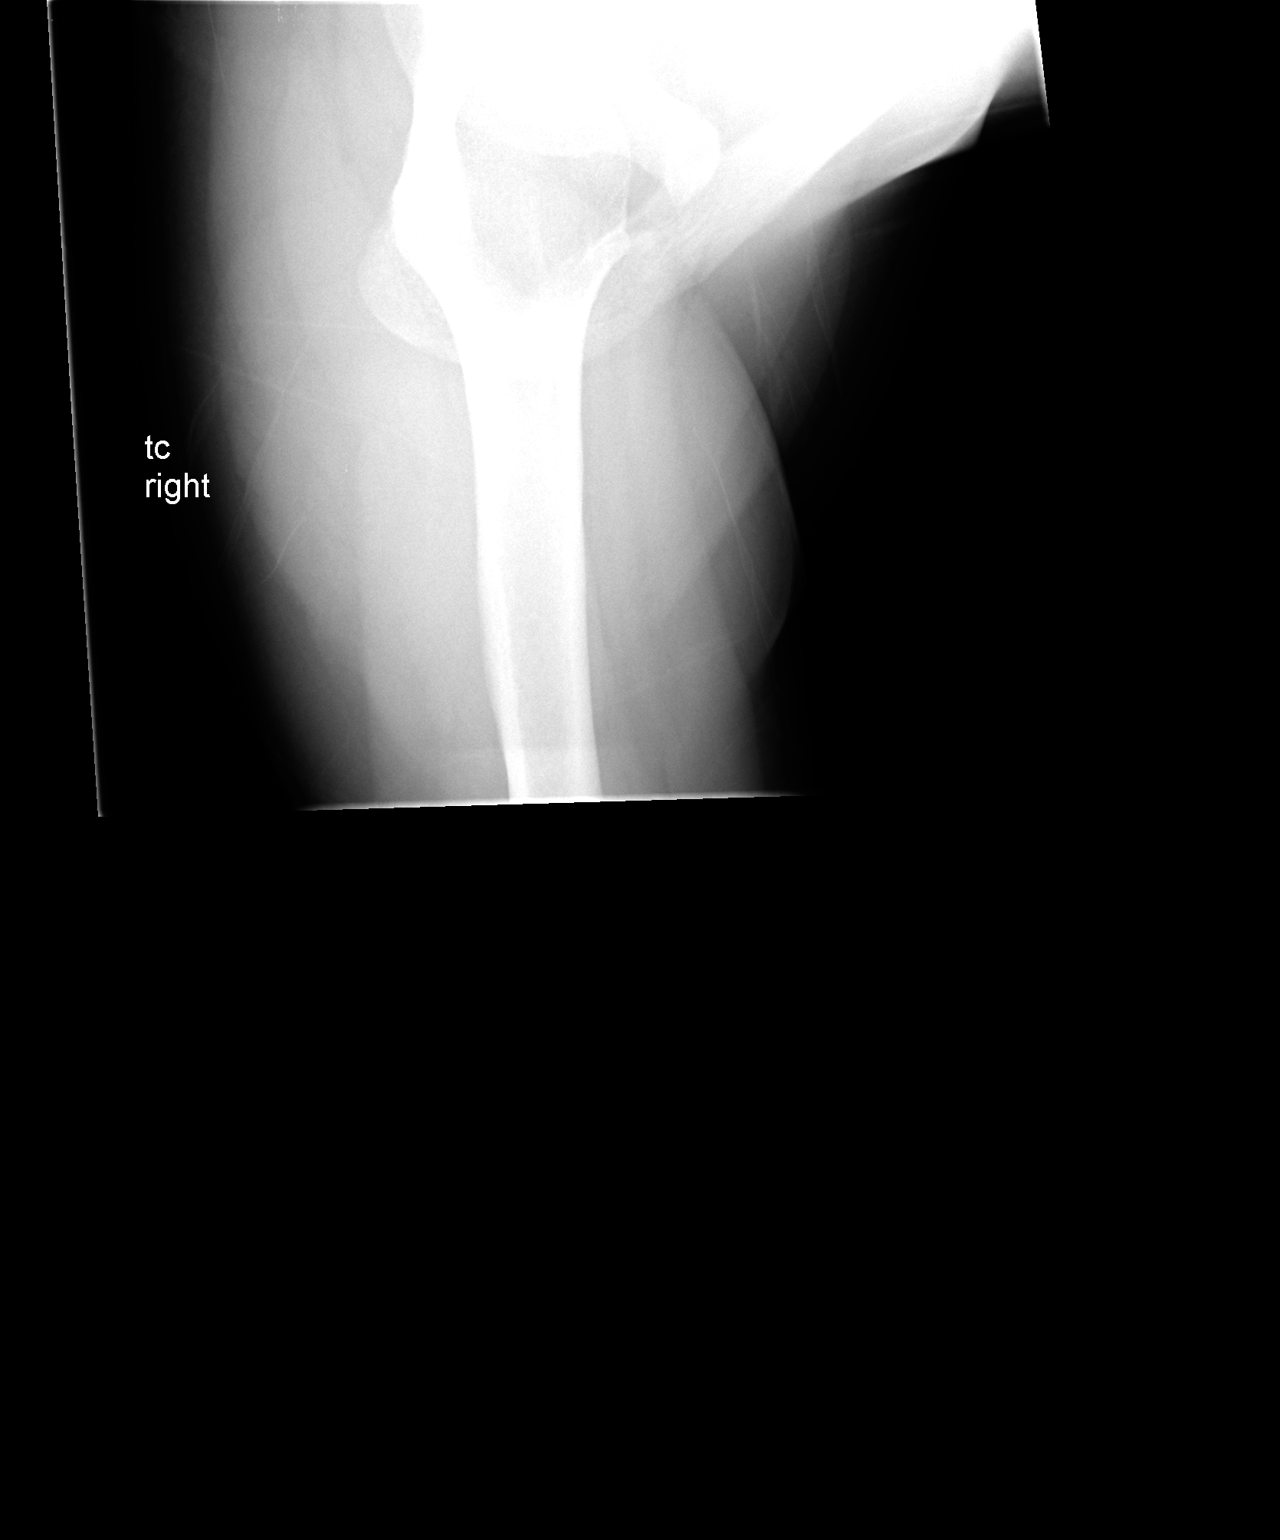

[3 of 3 positions shown; findings below may reference images not displayed]

FINDINGS: The joint spaces are maintained.  No acute bony findings
or degenerative changes.  Mild lateral downsloping of the acromion
is noted.  No abnormal soft tissue calcifications.  The right lung
is clear.
IMPRESSION: No acute bony findings.

## 2012-05-03 ENCOUNTER — Telehealth: Payer: Self-pay | Admitting: *Deleted

## 2012-05-03 ENCOUNTER — Encounter: Payer: Self-pay | Admitting: Family Medicine

## 2012-05-03 ENCOUNTER — Ambulatory Visit (INDEPENDENT_AMBULATORY_CARE_PROVIDER_SITE_OTHER): Payer: 59 | Admitting: Family Medicine

## 2012-05-03 ENCOUNTER — Other Ambulatory Visit: Payer: Self-pay | Admitting: Obstetrics & Gynecology

## 2012-05-03 VITALS — BP 133/85 | HR 82 | Ht 63.5 in | Wt 185.0 lb

## 2012-05-03 DIAGNOSIS — I10 Essential (primary) hypertension: Secondary | ICD-10-CM

## 2012-05-03 DIAGNOSIS — L708 Other acne: Secondary | ICD-10-CM

## 2012-05-03 DIAGNOSIS — L709 Acne, unspecified: Secondary | ICD-10-CM

## 2012-05-03 DIAGNOSIS — N970 Female infertility associated with anovulation: Secondary | ICD-10-CM

## 2012-05-03 MED ORDER — CLINDAMYCIN PHOS-BENZOYL PEROX 1-5 % EX GEL: Freq: Two times a day (BID) | CUTANEOUS | Status: DC

## 2012-05-03 MED ORDER — FLUTICASONE PROPIONATE 50 MCG/ACT NA SUSP: 1.0000 | Freq: Every day | NASAL | Status: DC

## 2012-05-03 MED ORDER — METHYLDOPA 250 MG PO TABS: 250.0000 mg | ORAL_TABLET | Freq: Three times a day (TID) | ORAL | Status: DC

## 2012-05-03 MED ORDER — CLOMIPHENE CITRATE 50 MG PO TABS: ORAL_TABLET | ORAL | Status: DC

## 2012-05-03 NOTE — Telephone Encounter (Signed)
LMP 05/02/12.  Pt wishes to increase her CC to 100 mg 5-9 of cycle.  Spoke with Dr Penne Lash who OK'd RX and this was sent to pt's pharmacy.

## 2012-05-03 NOTE — Progress Notes (Signed)
  Subjective:    Patient ID: Tammy Nicholson, female    DOB: 05-Mar-1971, 42 y.o.   MRN: [id number]  HPI HTN-  Pt denies chest pain, SOB, dizziness, or heart palpitations.  Taking meds as directed w/o problems.  Denies medication side effects.  She has missed a few doses on the aldomet bc of the TID dosing  She is still trying to get pregnant.  She is using clomid.  She is getting more acne on this.  Her periods are very irregular.    Acne - she is wanting refill on her clindamycin.    Review of Systems     Objective:   Physical Exam  Constitutional: She is oriented to person, place, and time. She appears well-developed and well-nourished.  HENT:  Head: Normocephalic and atraumatic.  Cardiovascular: Normal rate, regular rhythm and normal heart sounds.   Pulmonary/Chest: Effort normal and breath sounds normal.  Neurological: She is alert and oriented to person, place, and time.  Skin: Skin is warm and dry.  Psychiatric: She has a normal mood and affect. Her behavior is normal.          Assessment & Plan:  HTN - WEll controlled.  F/U in 4 months. Meds refilled. Try to be consistant with meds.   Infertility - working with gyn on this. On clomid. She is having a period now.    Acne - made worse by clomid. Wants refill on topical clindamycin gel.

## 2012-05-04 ENCOUNTER — Telehealth: Payer: Self-pay

## 2012-05-04 MED ORDER — CLINDAMYCIN PHOSPHATE 1 % EX GEL: CUTANEOUS | Status: DC

## 2012-05-04 NOTE — Telephone Encounter (Signed)
Tammy Nicholson cannot afford the Benzaclin gel and wants to know if you could call in one that has clindamycin only. [facility].

## 2012-08-31 ENCOUNTER — Encounter: Payer: Self-pay | Admitting: Family Medicine

## 2012-08-31 ENCOUNTER — Ambulatory Visit (INDEPENDENT_AMBULATORY_CARE_PROVIDER_SITE_OTHER): Payer: 59 | Admitting: Family Medicine

## 2012-08-31 VITALS — BP 130/81 | HR 85 | Wt 185.0 lb

## 2012-08-31 DIAGNOSIS — L738 Other specified follicular disorders: Secondary | ICD-10-CM

## 2012-08-31 DIAGNOSIS — E282 Polycystic ovarian syndrome: Secondary | ICD-10-CM

## 2012-08-31 DIAGNOSIS — L853 Xerosis cutis: Secondary | ICD-10-CM

## 2012-08-31 DIAGNOSIS — I10 Essential (primary) hypertension: Secondary | ICD-10-CM

## 2012-08-31 DIAGNOSIS — E348 Other specified endocrine disorders: Secondary | ICD-10-CM

## 2012-08-31 LAB — BASIC METABOLIC PANEL WITH GFR
BUN: 12 mg/dL (ref 6–23)
CO2: 26 mEq/L (ref 19–32)
Calcium: 9.5 mg/dL (ref 8.4–10.5)
Chloride: 99 mEq/L (ref 96–112)
Creat: 0.55 mg/dL (ref 0.50–1.10)
Glucose, Bld: 171 mg/dL — ABNORMAL HIGH (ref 70–99)

## 2012-08-31 MED ORDER — METFORMIN HCL 500 MG PO TABS: 500.0000 mg | ORAL_TABLET | Freq: Two times a day (BID) | ORAL | Status: DC

## 2012-08-31 NOTE — Progress Notes (Signed)
Quick Note:  All labs are normal. ______ 

## 2012-08-31 NOTE — Progress Notes (Signed)
Subjective:    Patient ID: Tammy Nicholson, female    DOB: 1970-04-23, 42 y.o.   MRN: [id number]  HPI HTN -  Pt denies chest pain, SOB, dizziness, or heart palpitations.  Taking meds as directed w/o problems.  Denies medication side effects. Taking HCTZ and methyldopa.  Yesterday had a HA and flushed.  Was nauseated as well. Has noticed can't read up close over the lat month. Has eye appt scheduled next month.   C/O itching skin. Says it seem more sensitive to the sun. Does try to wear sunscreen and 3/4 length sleeves. No rash. Does take Allegra and helps some.   IFG - Taking her metformin for her PCOS.  Occ checks her sugars.    Review of Systems BP 130/81  Pulse 85  Wt 185 lb (83.915 kg)  BMI 32.25 kg/m2    Allergies  Allergen Reactions  . Hydrocod Polst-Cpm Polst Er     REACTION: Rash  . Sulfamethoxazole W-Trimethoprim     REACTION: ithcy eyes, joint aches    Past Medical History  Diagnosis Date  . Polycystic ovarian disease   . Insulin resistance syndrome   . Polycystic disease, ovaries   . Allergy     seasonal  . Hypertension     Past Surgical History  Procedure Laterality Date  . Dilation and curettage of uterus    . Deviated septum repair    . Breast reduction surgery  08/2009    Dr. Etter Sjogren  . Heel spur excision  08/2009    Dr. Yates Decamp     History   Social History  . Marital Status: Single    Spouse Name: N/A    Number of Children: N/A  . Years of Education: N/A   Occupational History  . administrative asst    Social History Main Topics  . Smoking status: Never Smoker   . Smokeless tobacco: Never Used  . Alcohol Use: No  . Drug Use: No  . Sexually Active: Yes -- Female partner(s)   Other Topics Concern  . Not on file   Social History Narrative  . No narrative on file    Family History  Problem Relation Age of Onset  . Diabetes Father   . Hyperlipidemia Father   . Hypertension Father   . Coronary artery disease Father   . Breast  cancer Maternal Aunt   . Diabetes Other   . Diabetes Other   . Breast cancer Mother 80    post menopausal.   . Ovarian cancer Paternal Aunt   . Ovarian cancer Paternal Grandmother     Outpatient Encounter Prescriptions as of 08/31/2012  Medication Sig Dispense Refill  . AMBULATORY NON FORMULARY MEDICATION Medication Name: Accucheck Test Strips and Lancet- DX: 250.00 test 1 time a day  100 Units  1  . fexofenadine (ALLEGRA) 30 MG tablet Take 30 mg by mouth 2 (two) times daily.      . fluticasone (FLONASE) 50 MCG/ACT nasal spray Place 1-2 sprays into the nose daily.  16 g  3  . hydrochlorothiazide (HYDRODIURIL) 25 MG tablet Take 1 tablet (25 mg total) by mouth daily.  90 tablet  2  . lansoprazole (PREVACID) 15 MG capsule Take 15 mg by mouth daily.      . metFORMIN (GLUCOPHAGE) 500 MG tablet Take 1 tablet (500 mg total) by mouth 2 (two) times daily with a meal.  180 tablet  1  . methyldopa (ALDOMET) 250 MG tablet Take 1 tablet (250 mg total)  by mouth 3 (three) times daily.  90 tablet  5  . NON FORMULARY Accucheck Test Strips and Lancet- DX: 250.00 test 1 time a day       . prenatal vitamin w/FE, FA (PRENATAL 1 + 1) 27-1 MG TABS       . [DISCONTINUED] metFORMIN (GLUCOPHAGE) 500 MG tablet Take 1 tablet (500 mg total) by mouth daily with breakfast.  90 tablet  0  . [DISCONTINUED] clindamycin (CLINDAGEL) 1 % gel Apply to affected area 2 times daily  30 g  0  . [DISCONTINUED] clindamycin-benzoyl peroxide (BENZACLIN) gel Apply topically 2 (two) times daily.  25 g  3  . [DISCONTINUED] clomiPHENE (CLOMID) 50 MG tablet Take 2 tabs PO days 5-9 of cycle  10 tablet  3  . [DISCONTINUED] clomiPHENE (CLOMID) 50 MG tablet TAKE ONE TABLET BY MOUTH EVERY DAY  5 tablet  1  . [DISCONTINUED] meclizine (ANTIVERT) 25 MG tablet Take 1 tablet (25 mg total) by mouth 3 (three) times daily as needed for dizziness or nausea.  30 tablet  1  . [DISCONTINUED] simethicone (MYLICON) 125 MG chewable tablet Chew 125 mg by mouth  every 6 (six) hours as needed.       No facility-administered encounter medications on file as of 08/31/2012.          Objective:   Physical Exam  Constitutional: She is oriented to person, place, and time. She appears well-developed and well-nourished.  HENT:  Head: Normocephalic and atraumatic.  Cardiovascular: Normal rate, regular rhythm and normal heart sounds.   Pulmonary/Chest: Effort normal and breath sounds normal.  Neurological: She is alert and oriented to person, place, and time.  Skin: Skin is warm and dry.  Psychiatric: She has a normal mood and affect. Her behavior is normal.          Assessment & Plan:  HTN- Well controlled. Continue current regimen. Reminded her to try to be consistent with the Aldomet. She still trying to get pregnant which is why she is on this particular medication but does often forgets her midday dose. She's walking for exercise.  IFG/PCOS - Will increase metformin BID.  F/U in 4 months.  Walking for exercise.  Definitely encourage her to keep her eye exam appointment next month. PH to see if this improves as we increase her metformin but it may not. It may be that she needs reading glasses.  Dry skin-we discussed the importance of hydrating well. She does not currently applying lotion after her shower. This can make a huge difference with the itching that she's describing. Definitely continue to use a sunscreen. She may need to try different products on one that is less irritating to the skin. Certainly the hydrochlorothiazide that she takes can make her more sun sensitive.

## 2012-09-07 ENCOUNTER — Ambulatory Visit: Payer: 59

## 2012-10-04 ENCOUNTER — Other Ambulatory Visit: Payer: Self-pay

## 2012-10-12 ENCOUNTER — Encounter: Payer: Self-pay | Admitting: Family

## 2012-10-12 ENCOUNTER — Ambulatory Visit (INDEPENDENT_AMBULATORY_CARE_PROVIDER_SITE_OTHER): Payer: 59 | Admitting: Family

## 2012-10-12 VITALS — BP 129/84 | HR 89 | Resp 16 | Ht 63.0 in | Wt 186.0 lb

## 2012-10-12 DIAGNOSIS — Z01419 Encounter for gynecological examination (general) (routine) without abnormal findings: Secondary | ICD-10-CM

## 2012-10-12 DIAGNOSIS — Z124 Encounter for screening for malignant neoplasm of cervix: Secondary | ICD-10-CM

## 2012-10-12 DIAGNOSIS — N912 Amenorrhea, unspecified: Secondary | ICD-10-CM

## 2012-10-12 DIAGNOSIS — Z1151 Encounter for screening for human papillomavirus (HPV): Secondary | ICD-10-CM

## 2012-10-12 LAB — POCT URINE PREGNANCY: Preg Test, Ur: NEGATIVE

## 2012-10-12 NOTE — Patient Instructions (Signed)
Place premenopausal annual exam patient instructions here.  °

## 2012-10-12 NOTE — Progress Notes (Signed)
  Subjective:     Tammy Nicholson is a 42 y.o. female here for a routine exam.  Current complaints: Continued breast itching.  Disappointed because took Clomid incorrectly.  Aldomet recently added to her hypertension meds.  .  Personal health questionnaire reviewed: yes.   Gynecologic History Patient's last menstrual period was 09/03/2012. Contraception: none Last Pap: 40981. Results were: normal Last mammogram: 2009. Results were: normal  Obstetric History OB History   Grav Para Term Preterm Abortions TAB SAB Ect Mult Living   1 0 0 0 0 0 0 0 0 0      # Outc Date GA Lbr Len/2nd Wgt Sex Del Anes PTL Lv   1 GRA            Comments: System Generated. Please review and update pregnancy details.       The following portions of the patient's history were reviewed and updated as appropriate: allergies, current medications, past family history, past medical history, past social history, past surgical history and problem list.  Review of Systems Pertinent items are noted in HPI.    Objective:   Filed Vitals:   10/12/12 0812  BP: 129/84  Pulse: 89  Resp: 16   BP 129/84  Pulse 89  Resp 16  Ht 5\' 3"  (1.6 m)  Wt 186 lb (84.369 kg)  BMI 32.96 kg/m2  LMP 09/03/2012 General appearance: alert, cooperative and appears stated age Head: Normocephalic, without obvious abnormality, atraumatic Neck: no adenopathy, no carotid bruit, no JVD, supple, symmetrical, trachea midline and thyroid not enlarged, symmetric, no tenderness/mass/nodules Lungs: clear to auscultation bilaterally Breasts: normal appearance, no masses or tenderness, No nipple retraction or dimpling, No nipple discharge or bleeding, No axillary or supraclavicular adenopathy, Normal to palpation without dominant masses, Taught monthly breast self examination Heart: regular rate and rhythm, S1, S2 normal, no murmur, click, rub or gallop Abdomen: soft, non-tender; bowel sounds normal; no masses,  no organomegaly Pelvic: cervix  normal in appearance, external genitalia normal, no adnexal masses or tenderness, no cervical motion tenderness, rectovaginal septum normal, uterus normal size, shape, and consistency and vagina normal without discharge Skin: Skin color, texture, turgor normal. No rashes or lesions    Assessment:    Chronic Hypertension Diabetes Desires Pregnancy   Plan:    Mammogram ordered. P Pap smear to lab Will call office if cycle starts for direction on clomid (for pt certainty). Continue follow-up with Dr. Darra Lis [facility]

## 2012-11-14 ENCOUNTER — Ambulatory Visit (HOSPITAL_BASED_OUTPATIENT_CLINIC_OR_DEPARTMENT_OTHER)
Admission: RE | Admit: 2012-11-14 | Discharge: 2012-11-14 | Disposition: A | Discharge status: Home / Self Care | Payer: 59 | Source: Ambulatory Visit | Attending: Family | Admitting: Family

## 2012-11-14 DIAGNOSIS — Z1231 Encounter for screening mammogram for malignant neoplasm of breast: Secondary | ICD-10-CM | POA: Insufficient documentation

## 2012-11-14 DIAGNOSIS — N912 Amenorrhea, unspecified: Secondary | ICD-10-CM

## 2012-11-16 ENCOUNTER — Inpatient Hospital Stay (HOSPITAL_BASED_OUTPATIENT_CLINIC_OR_DEPARTMENT_OTHER): Admission: RE | Admit: 2012-11-16 | Payer: 59 | Source: Ambulatory Visit

## 2012-12-05 ENCOUNTER — Other Ambulatory Visit: Payer: Self-pay | Admitting: Family Medicine

## 2013-01-01 ENCOUNTER — Other Ambulatory Visit: Payer: Self-pay | Admitting: Family Medicine

## 2013-01-03 ENCOUNTER — Ambulatory Visit (INDEPENDENT_AMBULATORY_CARE_PROVIDER_SITE_OTHER): Payer: 59 | Admitting: Family Medicine

## 2013-01-03 ENCOUNTER — Encounter: Payer: Self-pay | Admitting: Family Medicine

## 2013-01-03 VITALS — BP 121/84 | HR 83 | Wt 185.0 lb

## 2013-01-03 DIAGNOSIS — M25519 Pain in unspecified shoulder: Secondary | ICD-10-CM

## 2013-01-03 DIAGNOSIS — Z23 Encounter for immunization: Secondary | ICD-10-CM

## 2013-01-03 DIAGNOSIS — M25511 Pain in right shoulder: Secondary | ICD-10-CM

## 2013-01-03 DIAGNOSIS — H9209 Otalgia, unspecified ear: Secondary | ICD-10-CM

## 2013-01-03 DIAGNOSIS — M7731 Calcaneal spur, right foot: Secondary | ICD-10-CM

## 2013-01-03 DIAGNOSIS — R1013 Epigastric pain: Secondary | ICD-10-CM

## 2013-01-03 DIAGNOSIS — K3189 Other diseases of stomach and duodenum: Secondary | ICD-10-CM

## 2013-01-03 DIAGNOSIS — M7730 Calcaneal spur, unspecified foot: Secondary | ICD-10-CM

## 2013-01-03 DIAGNOSIS — R7301 Impaired fasting glucose: Secondary | ICD-10-CM

## 2013-01-03 DIAGNOSIS — M79641 Pain in right hand: Secondary | ICD-10-CM

## 2013-01-03 DIAGNOSIS — M79609 Pain in unspecified limb: Secondary | ICD-10-CM

## 2013-01-03 DIAGNOSIS — H9202 Otalgia, left ear: Secondary | ICD-10-CM

## 2013-01-03 MED ORDER — METHYLDOPA 250 MG PO TABS: 250.0000 mg | ORAL_TABLET | Freq: Three times a day (TID) | ORAL | Status: DC

## 2013-01-03 MED ORDER — PREDNISONE 20 MG PO TABS: 40.0000 mg | ORAL_TABLET | Freq: Every day | ORAL | Status: DC

## 2013-01-03 NOTE — Progress Notes (Signed)
Subjective:    Patient ID: Tammy Nicholson, female    DOB: [birth year], 42 y.o.   MRN: [id number]  HPI IFG - last a1c was in December, 6.4. No regular exercise  Diet controlled.   HTN-  Pt denies chest pain, SOB, dizziness, or heart palpitations.  Taking meds as directed w/o problems.  Denies medication side effects.   Left ears feels muffled when she speaks x 2 months. No pain or discharge.  Uses a bobby pin to clean her ear.  Having a lot of nasal drianage. Ears have been popping. Feels it is affecting vision.  Had to get progressive lenses. Still having some balance issues.  Having some pain in her right shoulder. Will get a deep pinch if she sleeps on that shoulder.  She sleeps with patches and uses Bengay.  Has been going on for years.  Had seen a ortho years ago and they had rec shot but didn't persue it    Seeing a podiatrist for her heel spur on her right heel.    Bilat hand pain. Worse with acitivity. They will throb and occ feel swollen. The individual joints aren't swollen she just feels like all and becomes swollen. She is worried about possible arthritis.    She's having a lot of dyspepsia. She gets abdominal discomfort and feels like she is very gassy and bloated in the evenings. Indulges a lot. She takes Gas-X and it doesn't seem to provide any relief. She started taking a probiotic, acidophilus, without any significant relief.  Review of Systems  BP 121/84  Pulse 83  Wt 185 lb (83.915 kg)  BMI 32.78 kg/m2    Allergies  Allergen Reactions  . Hydrocod Polst-Cpm Polst Er     REACTION: Rash  . Sulfamethoxazole-Trimethoprim     REACTION: ithcy eyes, joint aches    Past Medical History  Diagnosis Date  . Polycystic ovarian disease   . Insulin resistance syndrome   . Polycystic disease, ovaries   . Allergy     seasonal  . Hypertension     Past Surgical History  Procedure Laterality Date  . Dilation and curettage of uterus    . Deviated septum repair    .  Breast reduction surgery  08/2009    Dr. Etter Sjogren  . Heel spur excision  08/2009    Dr. Yates Decamp     History   Social History  . Marital Status: Single    Spouse Name: N/A    Number of Children: N/A  . Years of Education: N/A   Occupational History  . administrative asst    Social History Main Topics  . Smoking status: Never Smoker   . Smokeless tobacco: Never Used  . Alcohol Use: No  . Drug Use: No  . Sexual Activity: Yes    Partners: Male   Other Topics Concern  . Not on file   Social History Narrative  . No narrative on file    Family History  Problem Relation Age of Onset  . Diabetes Father   . Hyperlipidemia Father   . Hypertension Father   . Coronary artery disease Father   . Breast cancer Maternal Aunt   . Diabetes Other   . Diabetes Other   . Breast cancer Mother 59    post menopausal.   . Ovarian cancer Paternal Aunt   . Ovarian cancer Paternal Grandmother     Outpatient Encounter Prescriptions as of 01/03/2013  Medication Sig Dispense Refill  . AMBULATORY NON FORMULARY  MEDICATION Medication Name: Accucheck Test Strips and Lancet- DX: 250.00 test 1 time a day  100 Units  1  . clomiPHENE (CLOMID) 50 MG tablet Take 50 mg by mouth daily. 2 tabs PO d- 5-9      . fexofenadine (ALLEGRA) 30 MG tablet Take 30 mg by mouth 2 (two) times daily.      . fluticasone (FLONASE) 50 MCG/ACT nasal spray Place 1-2 sprays into the nose daily.  16 g  3  . hydrochlorothiazide (HYDRODIURIL) 25 MG tablet Take 1 tablet by mouth  daily  90 tablet  0  . metFORMIN (GLUCOPHAGE) 500 MG tablet Take 1 tablet (500 mg total) by mouth 2 (two) times daily with a meal.  180 tablet  1  . methyldopa (ALDOMET) 250 MG tablet Take 1 tablet (250 mg total) by mouth 3 (three) times daily.  90 tablet  5  . NON FORMULARY Accucheck Test Strips and Lancet- DX: 250.00 test 1 time a day       . prenatal vitamin w/FE, FA (PRENATAL 1 + 1) 27-1 MG TABS       . [DISCONTINUED] methyldopa (ALDOMET) 250 MG  tablet Take 1 tablet (250 mg total) by mouth 3 (three) times daily.  90 tablet  5  . predniSONE (DELTASONE) 20 MG tablet Take 2 tablets (40 mg total) by mouth daily.  10 tablet  0   No facility-administered encounter medications on file as of 01/03/2013.          Objective:   Physical Exam  Constitutional: She is oriented to person, place, and time. She appears well-developed and well-nourished.  HENT:  Head: Normocephalic and atraumatic.  Right Ear: External ear normal.  Left Ear: External ear normal.  Nose: Nose normal.  Mouth/Throat: Oropharynx is clear and moist.  TMs and canals are clear.   Eyes: Conjunctivae and EOM are normal. Pupils are equal, round, and reactive to light.  Neck: Neck supple. No thyromegaly present.  Cardiovascular: Normal rate, regular rhythm and normal heart sounds.   Pulmonary/Chest: Effort normal and breath sounds normal. She has no wheezes.  Musculoskeletal:  Left shoulder with normal range of motion. She is tender of the biceps tendon. She has a positive empty can test on the right because of pain and discomfort.  Lymphadenopathy:    She has no cervical adenopathy.  Neurological: She is alert and oriented to person, place, and time.  Skin: Skin is warm and dry.  Psychiatric: She has a normal mood and affect.          Assessment & Plan:  HTN - well controlled on current regimen. Followup in 6 months.  Impaired fasting glucose-hemoglobin A1c is 6.3 today which is much better than last December. Continue current regimen and followup in 6 months.  Left ear - ear exam his self is normal. Consider eustachian tube dysfunction. We'll perform tympanometry as well as audiometry. Normal. Will try course of prednisone. Call if not better in 1 week. She is already on a nasal steroid spray and antihistamine.   Right shoulder - I. think she probably has some biceps tendinitis person location of her pain. I will give her handouts and stretches to do on her  own and try to get her in with my partner Dr. Rodney Langton for further evaluation.  Right heel spur-she did receive an injection about a month ago for a healed scar. She says it helped for about a week. She has not followed back up with him yet.  I think she can also discuss this with Dr. Benjamin Stain as well or consider going back to see a podiatrist.  Dyspepsia - recommend a trial of Prilosec or Nexium, both of which are over-the-counter for least 2 weeks. If working well then can continue for total of 6 weeks and then taper off. If not working after 2 weeks then she can call the office back and let me know and we'll consider GI referral.  Bilateral hand pain-suspect that this is probably more of an overuse of the hand. That we could certainly consider getting an x-ray to look for arthritis. There is no deformity or swelling of the actual joints. Also encouraged her to really watch her salt intake as this can cause hand swelling as well.  Time spent 45 min, >50% spent cousneling about ear fullness, shoulder pain, heel spur, dyspepsia, hand pain, hypertension, impaired fasting glucose.

## 2013-01-03 NOTE — Patient Instructions (Addendum)
Recommend that you try Prilosec or Nexium over-the-counter. Both are taken once a day. Try this for at least 2 weeks. If you're not noticing any symptom relief then please call the office back and I will refer you to a gastroenterologist for further evaluation. If it works well and is helping to alleviate some of her symptoms then I recommend that you complete a full 6 week course. Make sure to take the medication about 30 minutes before breakfast on an empty stomach. Call me in one week if your ear is not any better on the prednisone.

## 2013-01-04 ENCOUNTER — Ambulatory Visit: Payer: 59 | Admitting: Family Medicine

## 2013-01-08 ENCOUNTER — Ambulatory Visit (INDEPENDENT_AMBULATORY_CARE_PROVIDER_SITE_OTHER): Payer: 59 | Admitting: Sports Medicine

## 2013-01-08 VITALS — BP 130/83 | HR 88 | Wt 186.0 lb

## 2013-01-08 DIAGNOSIS — M25519 Pain in unspecified shoulder: Secondary | ICD-10-CM

## 2013-01-08 DIAGNOSIS — L821 Other seborrheic keratosis: Secondary | ICD-10-CM

## 2013-01-08 DIAGNOSIS — M722 Plantar fascial fibromatosis: Secondary | ICD-10-CM | POA: Insufficient documentation

## 2013-01-08 DIAGNOSIS — M25511 Pain in right shoulder: Secondary | ICD-10-CM

## 2013-01-08 DIAGNOSIS — Z23 Encounter for immunization: Secondary | ICD-10-CM

## 2013-01-08 DIAGNOSIS — L918 Other hypertrophic disorders of the skin: Secondary | ICD-10-CM | POA: Insufficient documentation

## 2013-01-08 DIAGNOSIS — N3090 Cystitis, unspecified without hematuria: Secondary | ICD-10-CM | POA: Insufficient documentation

## 2013-01-08 MED ORDER — PHENAZOPYRIDINE HCL 200 MG PO TABS: 200.0000 mg | ORAL_TABLET | Freq: Three times a day (TID) | ORAL | Status: DC

## 2013-01-08 MED ORDER — CEPHALEXIN 500 MG PO CAPS: 500.0000 mg | ORAL_CAPSULE | Freq: Two times a day (BID) | ORAL | Status: DC

## 2013-01-08 NOTE — Progress Notes (Deleted)

## 2013-01-08 NOTE — Assessment & Plan Note (Deleted)
Urine culture. Keflex, Pyridium. Return as needed for this.

## 2013-01-08 NOTE — Progress Notes (Signed)
   Subjective:    I'm seeing this patient as a consultation for [name],[name], MD :    CC: R shoulder pain and R foot pain  HPI: Pt reports a 12 month history of R shoulder pain that is worse with activity and is noted to be waking up at night.  She is unable to lay on her R side due to the pain.  She denies using any anti-inflammatories but notes over the past week with starting basic stretching exercises and glucosamine/chondrotin with moderate improvement.  Any activity seems to make it worse.  Denies radicular pain, denies significant weakness.  Also reports some proximal R wrist pain but does not seem to be related.  Also here for evaluation of R foot pain.  Hx of Heel spurs seen by podiatrist and received injection 3 months ago for Plantar Fasciitis.  Not using custom orthotics.  No formal exercises.  Past medical history, Surgical history, Family history not pertinant except as noted below, Social history, Allergies, and medications have been entered into the medical record, reviewed, and no changes needed.   Review of Systems: No headache, visual changes, nausea, vomiting, diarrhea, constipation, dizziness, abdominal pain, skin rash, fevers, chills, night sweats, weight loss, swollen lymph nodes, body aches, joint swelling, muscle aches, chest pain, shortness of breath, mood changes, visual or auditory hallucinations.   Objective:   General: Well Developed, well nourished, and in no acute distress.  Neuro/Psych: Alert and oriented x3, extra-ocular muscles intact, able to move all 4 extremities, sensation grossly intact. Skin: Warm and dry, no rashes noted.  Respiratory: Not using accessory muscles, speaking in full sentences, trachea midline.  Cardiovascular: Pulses palpable, no extremity edema. Abdomen: Does not appear distended. R shoulder Exam: Appear:  normal appearance, no swelling  Palp:  TTP over bicepital groove.    ROM:  Full ROM, mild R scapular dyskinesia  NV:   UE  myotomes 5+/5 B UE dermatomes intact to light touch  Testing:  + cross arm test, + hawkins, + empty can, + speeds,  Negative yergasons, Negative apprehension, negative B spurlings, negative drop arm test   R Foot Exam: Appear:  normal appearing, flat arch  Palp:  TTP over PF insertion   Impression and Recommendations:   This case required medical decision making of moderate complexity.

## 2013-01-08 NOTE — Assessment & Plan Note (Signed)
She does likely have some impingement syndrome as well as biceps tendinitis. Formal physical therapy, over-the-counter NSAIDs as needed. Return in one month, consider injection both into the biceps tendon sheath as well as sub-acromial bursa if no better.

## 2013-01-08 NOTE — Assessment & Plan Note (Deleted)
Cryotherapy performed. She does understand that it may take several treatments to get these completely resolved.

## 2013-01-08 NOTE — Assessment & Plan Note (Deleted)
We will remove these at a future date. 

## 2013-01-08 NOTE — Assessment & Plan Note (Signed)
Return for custom orthotics. Physical therapy. Consider injection if no better after a month.

## 2013-01-19 ENCOUNTER — Encounter: Payer: Self-pay | Admitting: Family Medicine

## 2013-01-29 ENCOUNTER — Encounter: Payer: Self-pay | Admitting: Obstetrics & Gynecology

## 2013-01-29 ENCOUNTER — Ambulatory Visit (INDEPENDENT_AMBULATORY_CARE_PROVIDER_SITE_OTHER): Payer: 59 | Admitting: Obstetrics & Gynecology

## 2013-01-29 VITALS — BP 129/85 | HR 86 | Resp 16 | Ht 63.0 in | Wt 185.0 lb

## 2013-01-29 DIAGNOSIS — IMO0002 Reserved for concepts with insufficient information to code with codable children: Secondary | ICD-10-CM

## 2013-01-29 DIAGNOSIS — N979 Female infertility, unspecified: Secondary | ICD-10-CM

## 2013-01-29 NOTE — Progress Notes (Signed)
Pt still wants to have a baby (has not been to see me for over 1 year).  Pt has PCOS and has irregular menses.  Pt had heavy menses in August and October, but she can go longer without menses.  Pt used Clomid for a few months this past year without success.  Pt did have a miscarriage in 2013.  Pap and mammogram are nml. Pt strongly encouraged to see Dr. Normajean Glasgow.   [facility] ordered today and referral made.

## 2013-01-29 NOTE — Progress Notes (Deleted)
  Subjective:    Patient ID: Tammy Nicholson, female    DOB: [birth year], 42 y.o.   MRN: [id number]  HPI  Pt satill wants to have a baby (has not been to see me for over 1 year).  Pt has PCOS and has irregular menses.  Pt had heavy menses in October and and August.  Review of Systems     Objective:   Physical Exam        Assessment & Plan:

## 2013-01-30 ENCOUNTER — Ambulatory Visit: Payer: 59 | Admitting: Physical Therapy

## 2013-02-06 LAB — ANTI MULLERIAN HORMONE: AMH AssessR: 5.69

## 2013-02-11 ENCOUNTER — Telehealth: Payer: Self-pay | Admitting: *Deleted

## 2013-02-11 NOTE — Telephone Encounter (Signed)
Called pt to see if appt made with Tammy Nicholson - records and labs have been sent - pt adv has not heard from Dr. Lenoria Farrier office but will call today and see if she can make appt since labs have been sent

## 2013-02-26 ENCOUNTER — Other Ambulatory Visit: Payer: Self-pay | Admitting: Family Medicine

## 2013-02-27 NOTE — Telephone Encounter (Signed)
Ok to fill 

## 2013-03-04 ENCOUNTER — Other Ambulatory Visit: Payer: Self-pay | Admitting: Family Medicine

## 2013-03-06 ENCOUNTER — Telehealth: Payer: Self-pay

## 2013-03-06 DIAGNOSIS — H9202 Otalgia, left ear: Secondary | ICD-10-CM

## 2013-03-06 NOTE — Telephone Encounter (Signed)
Her ear is not any better and would like a referral to ENT.

## 2013-03-07 NOTE — Telephone Encounter (Signed)
Tell her I am sorry she's not feeling better. Referral placed.

## 2013-03-08 NOTE — Telephone Encounter (Signed)
Pt informed.  Dessie Delcarlo, LPN  

## 2013-03-25 ENCOUNTER — Other Ambulatory Visit: Payer: Self-pay | Admitting: *Deleted

## 2013-03-25 MED ORDER — METFORMIN HCL 500 MG PO TABS: ORAL_TABLET | ORAL | Status: DC

## 2013-03-25 MED ORDER — HYDROCHLOROTHIAZIDE 25 MG PO TABS: ORAL_TABLET | ORAL | Status: DC

## 2013-04-19 ENCOUNTER — Ambulatory Visit: Payer: 59 | Admitting: Family Medicine

## 2013-04-23 ENCOUNTER — Ambulatory Visit: Payer: 59 | Admitting: Family Medicine

## 2013-05-29 ENCOUNTER — Other Ambulatory Visit: Payer: Self-pay | Admitting: Family Medicine

## 2013-06-25 ENCOUNTER — Encounter: Payer: Self-pay | Admitting: Family Medicine

## 2013-06-25 ENCOUNTER — Ambulatory Visit (INDEPENDENT_AMBULATORY_CARE_PROVIDER_SITE_OTHER): Payer: 59 | Admitting: Family Medicine

## 2013-06-25 VITALS — BP 122/78 | HR 99 | Ht 63.75 in | Wt 181.0 lb

## 2013-06-25 DIAGNOSIS — R002 Palpitations: Secondary | ICD-10-CM

## 2013-06-25 DIAGNOSIS — I10 Essential (primary) hypertension: Secondary | ICD-10-CM

## 2013-06-25 DIAGNOSIS — E348 Other specified endocrine disorders: Secondary | ICD-10-CM

## 2013-06-25 LAB — POCT UA - MICROALBUMIN
Albumin/Creatinine Ratio, Urine, POC: <30
Creatinine, POC: 200 mg/dL
Microalbumin Ur, POC: 80 mg/L

## 2013-06-25 LAB — POCT GLYCOSYLATED HEMOGLOBIN (HGB A1C): HEMOGLOBIN A1C: 6.5

## 2013-06-25 MED ORDER — AMBULATORY NON FORMULARY MEDICATION: Status: AC

## 2013-06-25 NOTE — Patient Instructions (Signed)
Check her blood pressures once a day and right abdominal wall. I would like to see back in 2-3 weeks to make sure you're feeling better and to make sure blood pressures are well regulated. Do not check her blood pressure more than once a day in the she's just feel poorly.

## 2013-06-25 NOTE — Progress Notes (Signed)
   Subjective:    Patient ID: Tammy Nicholson, female    DOB: [birth year], 43 y.o.   MRN: [id number]  HPI Hypertension- Pt denies chest pain, SOB, dizziness.  Taking meds as directed w/o problems.  Denies medication side effects.  Feels like having nightsweats a couple of nights ago. Felt like her pressure was running high. pressues were 138/85.  Heart racing started last weekend. Also feeling some pressure in her chest all day. Salt makes it worse.  She is taking Allegra but this is not new and she is not taking any decongestants for her allergies.  IFG -  Was on femora pills and has incrased her metformin to TID with her fertility specialist. She is on a prenatal vitramin. They're thinking about doing injections and possibly intrauterine insemination. She says she cannot afford in vitro fertilization at this point in time. She does have a history of polycystic ovarian syndrome.  Review of Systems     Objective:   Physical Exam  Constitutional: She is oriented to person, place, and time. She appears well-developed and well-nourished.  HENT:  Head: Normocephalic and atraumatic.  Cardiovascular: Normal rate, regular rhythm and normal heart sounds.   Pulmonary/Chest: Effort normal and breath sounds normal.  Neurological: She is alert and oriented to person, place, and time.  Skin: Skin is warm and dry.  Psychiatric: She has a normal mood and affect. Her behavior is normal.          Assessment & Plan:  HTN - well controlled on methyldopa and HCTZ.  Continue to follow blood pressures at home. She starts seeing systolic blood pressures greater than 140 then please call me back and we can make adjustments.  IFG - well controlled at 6.5 though, technically this puts her into the diabetic range.  Plan on exercise. On metformin TID.  Continue current regimen. followup in 3 months. Encourage regular exercise and weight loss.  Heart racing - Lets check blood count for anemia. Looks for iron def  anemia.  Check thyroid. EKG shows rate of 80 beats per minute, normal sinus rhythm with no acute changes. She does have an inverted T wave in lead 3. Gave reassurance. Encouraged her to keep it off of her blood pressures the next couple weeks. Take them daily. We can review those and adjust her medication regimen if needed.

## 2013-06-26 LAB — COMPLETE METABOLIC PANEL WITH GFR
ALBUMIN: 4.2 g/dL (ref 3.5–5.2)
ALT: 73 U/L — ABNORMAL HIGH (ref 0–35)
AST: 35 U/L (ref 0–37)
Alkaline Phosphatase: 59 U/L (ref 39–117)
BUN: 14 mg/dL (ref 6–23)
CALCIUM: 9.4 mg/dL (ref 8.4–10.5)
CO2: 25 meq/L (ref 19–32)
Chloride: 100 mEq/L (ref 96–112)
Creat: 0.61 mg/dL (ref 0.50–1.10)
GFR, Est Non African American: >89 mL/min
Glucose, Bld: 195 mg/dL — ABNORMAL HIGH (ref 70–99)
POTASSIUM: 3.7 meq/L (ref 3.5–5.3)
Sodium: 136 mEq/L (ref 135–145)
Total Bilirubin: 0.9 mg/dL (ref 0.2–1.2)
Total Protein: 7.2 g/dL (ref 6.0–8.3)

## 2013-06-26 LAB — TSH: TSH: 0.888 u[IU]/mL (ref 0.350–4.500)

## 2013-06-26 LAB — CBC WITH DIFFERENTIAL/PLATELET
BASOS ABS: 0 10*3/uL (ref 0.0–0.1)
Basophils Relative: 0 % (ref 0–1)
Eosinophils Absolute: 0.1 10*3/uL (ref 0.0–0.7)
Eosinophils Relative: 2 % (ref 0–5)
HCT: 37.2 % (ref 36.0–46.0)
Hemoglobin: 13 g/dL (ref 12.0–15.0)
LYMPHS PCT: 30 % (ref 12–46)
Lymphs Abs: 1.2 10*3/uL (ref 0.7–4.0)
MCH: 29.4 pg (ref 26.0–34.0)
MCHC: 34.9 g/dL (ref 30.0–36.0)
MCV: 84.2 fL (ref 78.0–100.0)
Monocytes Absolute: 0.2 10*3/uL (ref 0.1–1.0)
Monocytes Relative: 6 % (ref 3–12)
NEUTROS PCT: 62 % (ref 43–77)
Neutro Abs: 2.5 10*3/uL (ref 1.7–7.7)
PLATELETS: 269 10*3/uL (ref 150–400)
RBC: 4.42 MIL/uL (ref 3.87–5.11)
RDW: 14.5 % (ref 11.5–15.5)
WBC: 4 10*3/uL (ref 4.0–10.5)

## 2013-06-26 LAB — FERRITIN: Ferritin: 90 ng/mL (ref 10–291)

## 2013-06-27 ENCOUNTER — Other Ambulatory Visit: Payer: Self-pay | Admitting: *Deleted

## 2013-06-27 ENCOUNTER — Encounter: Payer: Self-pay | Admitting: *Deleted

## 2013-06-27 DIAGNOSIS — R748 Abnormal levels of other serum enzymes: Secondary | ICD-10-CM

## 2013-08-21 ENCOUNTER — Other Ambulatory Visit: Payer: Self-pay | Admitting: Family Medicine

## 2013-09-27 ENCOUNTER — Other Ambulatory Visit: Payer: Self-pay | Admitting: Family Medicine

## 2013-10-03 ENCOUNTER — Other Ambulatory Visit: Payer: Self-pay | Admitting: *Deleted

## 2013-10-03 ENCOUNTER — Other Ambulatory Visit: Payer: Self-pay | Admitting: Family Medicine

## 2013-10-03 NOTE — Telephone Encounter (Signed)
1 month refill sent for hydrochlorothiazide to pharmacy with msg that office appt needed for refills but this is also because pt was to follow up in June for dm. Corliss SkainsJamie Domanic Matusek, CMA

## 2013-10-16 ENCOUNTER — Telehealth: Payer: Self-pay | Admitting: *Deleted

## 2013-10-16 MED ORDER — HYDROCHLOROTHIAZIDE 25 MG PO TABS: ORAL_TABLET | ORAL | Status: DC

## 2013-10-16 NOTE — Telephone Encounter (Signed)
Pt advised that she was to keep check on her bp at home and she was to f/u on her blood sugar. appt made.Tammy PacasBarkley, Tammy Nicholson [name]

## 2013-10-17 ENCOUNTER — Other Ambulatory Visit: Payer: Self-pay | Admitting: Obstetrics & Gynecology

## 2013-10-17 DIAGNOSIS — Z1231 Encounter for screening mammogram for malignant neoplasm of breast: Secondary | ICD-10-CM

## 2013-10-21 ENCOUNTER — Other Ambulatory Visit: Payer: Self-pay | Admitting: Family Medicine

## 2013-10-26 ENCOUNTER — Other Ambulatory Visit: Payer: Self-pay | Admitting: Family Medicine

## 2013-11-04 ENCOUNTER — Ambulatory Visit (INDEPENDENT_AMBULATORY_CARE_PROVIDER_SITE_OTHER): Payer: 59 | Admitting: Family Medicine

## 2013-11-04 ENCOUNTER — Encounter: Payer: Self-pay | Admitting: Family Medicine

## 2013-11-04 VITALS — BP 121/81 | HR 97 | Wt 179.0 lb

## 2013-11-04 DIAGNOSIS — R42 Dizziness and giddiness: Secondary | ICD-10-CM

## 2013-11-04 DIAGNOSIS — E348 Other specified endocrine disorders: Secondary | ICD-10-CM

## 2013-11-04 DIAGNOSIS — I10 Essential (primary) hypertension: Secondary | ICD-10-CM

## 2013-11-04 DIAGNOSIS — R002 Palpitations: Secondary | ICD-10-CM

## 2013-11-04 LAB — TSH: TSH: 1.15 u[IU]/mL (ref 0.350–4.500)

## 2013-11-04 LAB — CBC WITH DIFFERENTIAL/PLATELET
BASOS PCT: 0 % (ref 0–1)
Basophils Absolute: 0 10*3/uL (ref 0.0–0.1)
Eosinophils Absolute: 0.1 10*3/uL (ref 0.0–0.7)
Eosinophils Relative: 1 % (ref 0–5)
HCT: 40.4 % (ref 36.0–46.0)
HEMOGLOBIN: 14.1 g/dL (ref 12.0–15.0)
LYMPHS PCT: 27 % (ref 12–46)
Lymphs Abs: 1.6 10*3/uL (ref 0.7–4.0)
MCH: 29.6 pg (ref 26.0–34.0)
MCHC: 34.9 g/dL (ref 30.0–36.0)
MCV: 84.9 fL (ref 78.0–100.0)
MONOS PCT: 5 % (ref 3–12)
Monocytes Absolute: 0.3 10*3/uL (ref 0.1–1.0)
NEUTROS ABS: 4 10*3/uL (ref 1.7–7.7)
NEUTROS PCT: 67 % (ref 43–77)
Platelets: 255 10*3/uL (ref 150–400)
RBC: 4.76 MIL/uL (ref 3.87–5.11)
RDW: 14.2 % (ref 11.5–15.5)
WBC: 6 10*3/uL (ref 4.0–10.5)

## 2013-11-04 LAB — COMPLETE METABOLIC PANEL WITH GFR
ALK PHOS: 59 U/L (ref 39–117)
ALT: 99 U/L — ABNORMAL HIGH (ref 0–35)
AST: 55 U/L — ABNORMAL HIGH (ref 0–37)
Albumin: 4.4 g/dL (ref 3.5–5.2)
BUN: 14 mg/dL (ref 6–23)
CO2: 25 meq/L (ref 19–32)
CREATININE: 0.5 mg/dL (ref 0.50–1.10)
Calcium: 9.9 mg/dL (ref 8.4–10.5)
Chloride: 96 mEq/L (ref 96–112)
GLUCOSE: 112 mg/dL — AB (ref 70–99)
Potassium: 4 mEq/L (ref 3.5–5.3)
Sodium: 134 mEq/L — ABNORMAL LOW (ref 135–145)
TOTAL PROTEIN: 7.7 g/dL (ref 6.0–8.3)
Total Bilirubin: 1 mg/dL (ref 0.2–1.2)

## 2013-11-04 LAB — VITAMIN B12: VITAMIN B 12: 753 pg/mL (ref 211–911)

## 2013-11-04 LAB — POCT CBG (FASTING - GLUCOSE)-MANUAL ENTRY: Glucose Fasting, POC: 141 mg/dL — AB (ref 70–99)

## 2013-11-04 LAB — POCT GLYCOSYLATED HEMOGLOBIN (HGB A1C): Hemoglobin A1C: 6.4

## 2013-11-04 LAB — FERRITIN: FERRITIN: 104 ng/mL (ref 10–291)

## 2013-11-04 MED ORDER — METOPROLOL SUCCINATE ER 50 MG PO TB24: 50.0000 mg | ORAL_TABLET | Freq: Every day | ORAL | Status: DC

## 2013-11-04 NOTE — Progress Notes (Signed)
Subjective:    Patient ID: Tammy Nicholson, female    DOB: 23-Jun-1970, 43 y.o.   MRN: [id number]  HPI IFG - Glucose was 138.  No regular exerise.   Palpitations - she's been having some more recent palpitations. She says sometimes at night she notices it more than during the daytime. At home she's been checking her blood pressures and her pulse and it has been running in the low 100s. Anywhere from 100 210.  Hypertension- Pt denies chest pain, SOB, dizziness, or heart palpitations.  Taking meds as directed w/o problems.  Denies medication side effects.  Home BPs will borderline elevated diastolic, in the upper 80s and low 90s.   Started feeling lightheaded dizzy while in the room. Says feels sweaty.  Has also been having sweats at night. She's noticed some occasional chest discomfort. Not necessarily with activity. Can happen anytime. No fevers chills.  Review of Systems  BP 121/81  Pulse 97  Wt 179 lb (81.194 kg)    Allergies  Allergen Reactions  . Hydrocod Polst-Cpm Polst Er     REACTION: Rash  . Sulfamethoxazole-Trimethoprim     REACTION: ithcy eyes, joint aches    Past Medical History  Diagnosis Date  . Polycystic ovarian disease   . Insulin resistance syndrome   . Polycystic disease, ovaries   . Allergy     seasonal  . Hypertension     Past Surgical History  Procedure Laterality Date  . Dilation and curettage of uterus    . Deviated septum repair    . Breast reduction surgery  08/2009    Dr. Etter Sjogren  . Heel spur excision  08/2009    Dr. Yates Decamp     History   Social History  . Marital Status: Single    Spouse Name: N/A    Number of Children: N/A  . Years of Education: N/A   Occupational History  . administrative asst    Social History Main Topics  . Smoking status: Never Smoker   . Smokeless tobacco: Never Used  . Alcohol Use: No  . Drug Use: No  . Sexual Activity: Yes    Partners: Male   Other Topics Concern  . Not on file   Social  History Narrative  . No narrative on file    Family History  Problem Relation Age of Onset  . Diabetes Father   . Hyperlipidemia Father   . Hypertension Father   . Coronary artery disease Father   . Breast cancer Maternal Aunt   . Diabetes Other   . Diabetes Other   . Breast cancer Mother 43    post menopausal.   . Ovarian cancer Paternal Aunt   . Ovarian cancer Paternal Grandmother     Outpatient Encounter Prescriptions as of 11/04/2013  Medication Sig  . AMBULATORY NON FORMULARY MEDICATION Medication Name: Accucheck Test Strips and Lancet- DX: 250.00 test 1 time a day  . AMBULATORY NON FORMULARY MEDICATION Medication Name: test strips Test once a day Dx:250.00  . esomeprazole (NEXIUM) 40 MG capsule Take 40 mg by mouth daily at 12 noon.  . fexofenadine (ALLEGRA) 30 MG tablet Take 30 mg by mouth 2 (two) times daily.  . hydrochlorothiazide (HYDRODIURIL) 25 MG tablet Take 1 tablet by mouth  daily  . metFORMIN (GLUCOPHAGE) 500 MG tablet TAKE ONE TABLET BY MOUTH THREE TIMES DAILY WITH MEALS.  . NON FORMULARY Accucheck Test Strips and Lancet- DX: 250.00 test 1 time a day   . prenatal  vitamin w/FE, FA (PRENATAL 1 + 1) 27-1 MG TABS   . triamcinolone (NASACORT) 55 MCG/ACT AERO nasal inhaler Place 2 sprays into the nose daily.  . [DISCONTINUED] metFORMIN (GLUCOPHAGE) 500 MG tablet TAKE ONE TABLET BY MOUTH TWICE DAILY WITH MEALS.  . [DISCONTINUED] methyldopa (ALDOMET) 250 MG tablet TAKE ONE TABLET BY MOUTH THREE TIMES DAILY **NEED OFFICE VISIT  . metoprolol succinate (TOPROL-XL) 50 MG 24 hr tablet Take 1 tablet (50 mg total) by mouth daily. Take with or immediately following a meal.  . [DISCONTINUED] clindamycin (CLINDAGEL) 1 % gel APPLY TO AFFECTED AREAS 2 TIMES A DAY  . [DISCONTINUED] clomiPHENE (CLOMID) 50 MG tablet Take 50 mg by mouth daily. 2 tabs PO d- 5-9  . [DISCONTINUED] fluticasone (FLONASE) 50 MCG/ACT nasal spray Place 1-2 sprays into the nose daily.  . [DISCONTINUED] methyldopa  (ALDOMET) 250 MG tablet TAKE ONE TABLET BY MOUTH THREE TIMES DAILY **NEED OFFICE VISIT          Objective:   Physical Exam  Constitutional: She is oriented to person, place, and time. She appears well-developed and well-nourished.  HENT:  Head: Normocephalic and atraumatic.  Cardiovascular: Normal rate, regular rhythm and normal heart sounds.   Pulmonary/Chest: Effort normal and breath sounds normal.  Neurological: She is alert and oriented to person, place, and time.  Skin: Skin is warm and dry.  Psychiatric: She has a normal mood and affect. Her behavior is normal.          Assessment & Plan:  IFG - A1C is 6.4 today. Well controlled but borderline.  Consider increasing metformin.   Palpitations-will change to metoprolol. EKG today shows rate of 80 beats per minute, normal sinus rhythm. She has in her T waves in lead 3. Poor R wave progression in the lateral leads. Review results with patient today and give her reassurance. I'm hoping that her on a beta blocker for control palpitations. We'll also check blood work to evaluate for anemia, thyroid disorder etc.  Hypertension - will change to metoprolol. F/u in 1 month ot recheck BP.    Dizzy - will check thyroid, etc. She is not hypotensive.

## 2013-11-06 ENCOUNTER — Other Ambulatory Visit: Payer: Self-pay | Admitting: Family Medicine

## 2013-11-06 ENCOUNTER — Telehealth: Payer: Self-pay | Admitting: *Deleted

## 2013-11-06 DIAGNOSIS — R748 Abnormal levels of other serum enzymes: Secondary | ICD-10-CM

## 2013-11-06 NOTE — Telephone Encounter (Signed)
Yes, still needs to take the hydrochlorothiazide. As far as the metoprolol is concerned we can change it to the regular release, instead of the extended release,. It will be cheaper but she will have to take it twice a day. If she is okay with that then please let me know and I will send her for new prescription.

## 2013-11-06 NOTE — Telephone Encounter (Signed)
Pt called and wanted to know if there was something else that she could take instead of the metoprolol xl she stated that her co pay was $40. She also wanted to know if she should continue taking the hctz. Please advise.Loralee PacasBarkley, Suzette Flagler [place]

## 2013-11-07 NOTE — Telephone Encounter (Signed)
lvm w/recommendations.Tammy Nicholson Tammy Nicholson  

## 2013-11-08 MED ORDER — METOPROLOL TARTRATE 50 MG PO TABS: 50.0000 mg | ORAL_TABLET | Freq: Two times a day (BID) | ORAL | Status: DC

## 2013-11-11 ENCOUNTER — Encounter: Payer: Self-pay | Admitting: Family Medicine

## 2013-11-11 ENCOUNTER — Other Ambulatory Visit: Payer: 59

## 2013-11-11 ENCOUNTER — Ambulatory Visit (INDEPENDENT_AMBULATORY_CARE_PROVIDER_SITE_OTHER): Payer: 59

## 2013-11-11 DIAGNOSIS — K7689 Other specified diseases of liver: Secondary | ICD-10-CM

## 2013-11-11 DIAGNOSIS — R748 Abnormal levels of other serum enzymes: Secondary | ICD-10-CM

## 2013-11-20 ENCOUNTER — Ambulatory Visit (HOSPITAL_BASED_OUTPATIENT_CLINIC_OR_DEPARTMENT_OTHER)
Admission: RE | Admit: 2013-11-20 | Discharge: 2013-11-20 | Disposition: A | Discharge status: Home / Self Care | Payer: 59 | Source: Ambulatory Visit | Attending: Obstetrics & Gynecology | Admitting: Obstetrics & Gynecology

## 2013-11-20 DIAGNOSIS — Z1231 Encounter for screening mammogram for malignant neoplasm of breast: Secondary | ICD-10-CM

## 2013-11-28 ENCOUNTER — Ambulatory Visit (INDEPENDENT_AMBULATORY_CARE_PROVIDER_SITE_OTHER): Payer: 59 | Admitting: Obstetrics & Gynecology

## 2013-11-28 ENCOUNTER — Encounter: Payer: Self-pay | Admitting: Obstetrics & Gynecology

## 2013-11-28 VITALS — BP 125/83 | HR 68 | Resp 16 | Ht 63.0 in | Wt 182.0 lb

## 2013-11-28 DIAGNOSIS — Z01419 Encounter for gynecological examination (general) (routine) without abnormal findings: Secondary | ICD-10-CM

## 2013-11-28 DIAGNOSIS — Z124 Encounter for screening for malignant neoplasm of cervix: Secondary | ICD-10-CM

## 2013-11-28 DIAGNOSIS — Z1151 Encounter for screening for human papillomavirus (HPV): Secondary | ICD-10-CM

## 2013-11-28 MED ORDER — MEDROXYPROGESTERONE ACETATE 10 MG PO TABS: 10.0000 mg | ORAL_TABLET | Freq: Every day | ORAL | Status: DC

## 2013-11-28 NOTE — Progress Notes (Signed)
  Subjective:     Tammy Nicholson is a 43 y.o. female here for a routine exam.  Current complaints: no menses for 2 months.  Pt was seeing Dr. Jeannie Fend for infertility treatments.  She took a break b/s her partner was having "performance anxiety".  If pt does not have a menses this month and does not resum care with Dr. Jeannie Fend, she will need a provera w/d bleed.  Personal health questionnaire reviewed: yes.   Gynecologic History Patient's last menstrual period was 11/25/2013. Contraception: none Last Pap: 2013. Results were: normal Last mammogram: 2015. Results were: normal  Obstetric History OB History  Gravida Para Term Preterm AB SAB TAB Ectopic Multiple Living     # Outcome Date GA Lbr Len/2nd Weight Sex Delivery Anes PTL Lv  1 SAB 10/08/11             Comments: System Generated. Please review and update pregnancy details.       The following portions of the patient's history were reviewed and updated as appropriate: allergies, current medications, past family history, past medical history, past social history, past surgical history and problem list.  Review of Systems Pertinent items are noted in HPI.    Objective:      Filed Vitals:   11/28/13 1609  BP: 125/83  Pulse: 68  Resp: 16  Height:  (1.6 m)  Weight: 182 lb (82.555 kg)   Vitals:  WNL General appearance: alert, cooperative and no distress Head: Normocephalic, without obvious abnormality, atraumatic Eyes: negative Throat: lips, mucosa, and tongue normal; teeth and gums normal Lungs: clear to auscultation bilaterally Breasts: normal appearance, no masses or tenderness, No nipple retraction or dimpling, No nipple discharge or bleeding Heart: regular rate and rhythm Abdomen: soft, non-tender; bowel sounds normal; no masses,  no organomegaly  Pelvic:  External Genitalia:  Tanner V, no lesion Urethra:  No prolapse Vagina:  Pink, normal rugae, no blood or discharge Cervix:  No CMT, no lesion,  small amt of bleeding after pap. Uterus:  Normal size and contour, non tender Adnexa:  Normal, no masses, non tender  Extremities: no edema, redness or tenderness in the calves or thighs Skin: no lesions or rash Lymph nodes: Axillary adenopathy: none        Assessment:    Healthy female exam.  PCO   Plan:    Education reviewed: self breast exams. Contraception: none. Follow up in: 1 year. Start provera early October if no menses (after negative pregnancy test) Restart infertility treatments with Dr Jeannie Fend when she is ready. Pap with cotesting.

## 2013-12-03 LAB — CYTOLOGY - PAP

## 2013-12-20 ENCOUNTER — Telehealth: Payer: Self-pay | Admitting: Family Medicine

## 2013-12-20 NOTE — Telephone Encounter (Signed)
Pt called. She would like a Doctor's note. 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 It does not appear that she has ever been seen for this problem. She explains she has financial issues and has not been able to come in. Thank you.

## 2013-12-23 NOTE — Telephone Encounter (Signed)
Ok for note.  Ok to list shoulder pain and back pain as reasons she cannot lift more than 15 lbs.  Thank you.

## 2013-12-23 NOTE — Telephone Encounter (Signed)
Letter printed and patient will pick up.

## 2013-12-30 ENCOUNTER — Encounter: Payer: Self-pay | Admitting: Family Medicine

## 2013-12-30 ENCOUNTER — Ambulatory Visit (INDEPENDENT_AMBULATORY_CARE_PROVIDER_SITE_OTHER): Payer: 59 | Admitting: Family Medicine

## 2013-12-30 VITALS — BP 120/72 | HR 76 | Temp 98.7°F | Wt 180.0 lb

## 2013-12-30 DIAGNOSIS — K5900 Constipation, unspecified: Secondary | ICD-10-CM

## 2013-12-30 DIAGNOSIS — M419 Scoliosis, unspecified: Secondary | ICD-10-CM

## 2013-12-30 DIAGNOSIS — E8881 Metabolic syndrome: Secondary | ICD-10-CM

## 2013-12-30 LAB — GLUCOSE, POCT (MANUAL RESULT ENTRY): POC Glucose: 117 mg/dl — AB (ref 70–99)

## 2013-12-30 NOTE — Progress Notes (Signed)
   Subjective:    Patient ID: Tammy Nicholson, female    DOB: 09-Jun-1970, 43 y.o.   MRN: [id number]  Constipation   For the last week she has had hard small BMs.  She has had to strain.  She has been eating more cheese. No change in supplements.She has been eating more cheese. No nausea.  Has ahd some bright red blood in toilet.  + bloating and gas.   Her office location has moved and she had to help move the office and has a hx of scolioisi and back pain and heel spurs.   Review of Systems  Gastrointestinal: Positive for constipation.       Objective:   Physical Exam  Constitutional: She is oriented to person, place, and time. She appears well-developed and well-nourished.  HENT:  Head: Normocephalic and atraumatic.  Abdominal: Soft. Bowel sounds are normal. She exhibits no distension and no mass. There is tenderness. There is no rebound and no guarding.  Tender in the left lower quadrant in the right lower quadrant to  Neurological: She is alert and oriented to person, place, and time.  Skin: Skin is warm and dry.  Psychiatric: She has a normal mood and affect. Her behavior is normal.          Assessment & Plan:  Constipation-we discussed some over-the-counter solutions including MiraLax. Can use twice a day until soft bowel movements and then decrease. If she needs something more appropriate preventative after that she can certainly use stool softeners. Make sure increasing fiber in the diet and increasing water intake.  Impaired Fasting glucose-provided a letter asking that she be able to take a 10 minute break in the middle of the morning to eat to help maintain her blood sugars.

## 2013-12-30 NOTE — Patient Instructions (Signed)
Recommend Miralax - 1 capful mixed with glass of water twice a day until soft bowel mvoement Make sure drinking plenty of water.

## 2014-01-10 ENCOUNTER — Ambulatory Visit (INDEPENDENT_AMBULATORY_CARE_PROVIDER_SITE_OTHER): Payer: 59 | Admitting: Sports Medicine

## 2014-01-10 ENCOUNTER — Encounter: Payer: Self-pay | Admitting: Sports Medicine

## 2014-01-10 VITALS — BP 109/67 | HR 66 | Wt 182.0 lb

## 2014-01-10 DIAGNOSIS — M722 Plantar fascial fibromatosis: Secondary | ICD-10-CM

## 2014-01-10 MED ORDER — MELOXICAM 15 MG PO TABS: ORAL_TABLET | ORAL | Status: DC

## 2014-01-10 NOTE — Assessment & Plan Note (Signed)
Persistent right sided plantar fasciitis type symptoms. Did not followup after last visit. Return for custom orthotics, she does need to purchase some flat shoes or athletic shoes. Meloxicam. Formal physical therapy.

## 2014-01-10 NOTE — Progress Notes (Signed)
  Subjective:    CC: Followup  HPI: I saw this pleasant 43 year old female sometime ago, she did not follow through with rehabilitation exercises or custom orthotics. She returns today with persistent pain in the right heel at the calcaneal origin of the plantar fascia, worse in the mornings.  Past medical history, Surgical history, Family history not pertinant except as noted below, Social history, Allergies, and medications have been entered into the medical record, reviewed, and no changes needed.   Review of Systems: No fevers, chills, night sweats, weight loss, chest pain, or shortness of breath.   Objective:    General: Well Developed, well nourished, and in no acute distress.  Neuro: Alert and oriented x3, extra-ocular muscles intact, sensation grossly intact.  HEENT: Normocephalic, atraumatic, pupils equal round reactive to light, neck supple, no masses, no lymphadenopathy, thyroid nonpalpable.  Skin: Warm and dry, no rashes. Cardiac: Regular rate and rhythm, no murmurs rubs or gallops, no lower extremity edema.  Respiratory: Clear to auscultation bilaterally. Not using accessory muscles, speaking in full sentences. Bilateral Foot: No visible erythema or swelling. Range of motion is full in all directions. Strength is 5/5 in all directions. No hallux valgus. No pes cavus or pes planus. No abnormal callus noted. No pain over the navicular prominence, or base of fifth metatarsal. No tenderness to palpation of the calcaneal insertion of plantar fascia. No pain at the Achilles insertion. No pain over the calcaneal bursa. No pain of the retrocalcaneal bursa. No tenderness to palpation over the tarsals, metatarsals, or phalanges. No hallux rigidus or limitus. No tenderness palpation over interphalangeal joints. No pain with compression of the metatarsal heads. Neurovascularly intact distally.  Impression and Recommendations:

## 2014-01-16 ENCOUNTER — Ambulatory Visit: Payer: 59 | Admitting: Obstetrics & Gynecology

## 2014-01-20 ENCOUNTER — Other Ambulatory Visit: Payer: Self-pay | Admitting: Family Medicine

## 2014-01-20 ENCOUNTER — Other Ambulatory Visit: Payer: Self-pay

## 2014-01-20 MED ORDER — METFORMIN HCL 500 MG PO TABS: 500.0000 mg | ORAL_TABLET | Freq: Three times a day (TID) | ORAL | Status: DC

## 2014-01-21 ENCOUNTER — Encounter: Payer: Self-pay | Admitting: Sports Medicine

## 2014-01-21 ENCOUNTER — Ambulatory Visit (INDEPENDENT_AMBULATORY_CARE_PROVIDER_SITE_OTHER): Payer: 59 | Admitting: Sports Medicine

## 2014-01-21 ENCOUNTER — Other Ambulatory Visit: Payer: Self-pay

## 2014-01-21 VITALS — BP 117/66 | HR 68 | Ht 63.0 in

## 2014-01-21 DIAGNOSIS — M722 Plantar fascial fibromatosis: Secondary | ICD-10-CM

## 2014-01-21 MED ORDER — METFORMIN HCL 500 MG PO TABS: 500.0000 mg | ORAL_TABLET | Freq: Three times a day (TID) | ORAL | Status: DC

## 2014-01-21 NOTE — Progress Notes (Signed)

## 2014-01-21 NOTE — Assessment & Plan Note (Signed)
Custom orthotics as above. These were comfortable. Return in one month, interventional treatment if no better.

## 2014-01-27 ENCOUNTER — Encounter: Payer: Self-pay | Admitting: Sports Medicine

## 2014-01-30 ENCOUNTER — Ambulatory Visit (INDEPENDENT_AMBULATORY_CARE_PROVIDER_SITE_OTHER): Payer: 59 | Admitting: Physical Therapy

## 2014-01-30 DIAGNOSIS — R262 Difficulty in walking, not elsewhere classified: Secondary | ICD-10-CM

## 2014-01-30 DIAGNOSIS — M722 Plantar fascial fibromatosis: Secondary | ICD-10-CM

## 2014-01-30 DIAGNOSIS — M25673 Stiffness of unspecified ankle, not elsewhere classified: Secondary | ICD-10-CM

## 2014-01-30 DIAGNOSIS — M25579 Pain in unspecified ankle and joints of unspecified foot: Secondary | ICD-10-CM

## 2014-02-03 ENCOUNTER — Encounter (INDEPENDENT_AMBULATORY_CARE_PROVIDER_SITE_OTHER): Payer: 59 | Admitting: Physical Therapy

## 2014-02-03 DIAGNOSIS — M722 Plantar fascial fibromatosis: Secondary | ICD-10-CM

## 2014-02-03 DIAGNOSIS — M25579 Pain in unspecified ankle and joints of unspecified foot: Secondary | ICD-10-CM

## 2014-02-03 DIAGNOSIS — M25673 Stiffness of unspecified ankle, not elsewhere classified: Secondary | ICD-10-CM

## 2014-02-03 DIAGNOSIS — R262 Difficulty in walking, not elsewhere classified: Secondary | ICD-10-CM

## 2014-02-05 ENCOUNTER — Encounter: Payer: Self-pay | Admitting: Family Medicine

## 2014-02-05 ENCOUNTER — Ambulatory Visit (INDEPENDENT_AMBULATORY_CARE_PROVIDER_SITE_OTHER): Payer: 59 | Admitting: Family Medicine

## 2014-02-05 ENCOUNTER — Encounter (INDEPENDENT_AMBULATORY_CARE_PROVIDER_SITE_OTHER): Payer: 59 | Admitting: Physical Therapy

## 2014-02-05 VITALS — BP 108/64 | HR 67 | Wt 182.0 lb

## 2014-02-05 DIAGNOSIS — I10 Essential (primary) hypertension: Secondary | ICD-10-CM

## 2014-02-05 DIAGNOSIS — M25579 Pain in unspecified ankle and joints of unspecified foot: Secondary | ICD-10-CM

## 2014-02-05 DIAGNOSIS — R262 Difficulty in walking, not elsewhere classified: Secondary | ICD-10-CM

## 2014-02-05 DIAGNOSIS — R7301 Impaired fasting glucose: Secondary | ICD-10-CM

## 2014-02-05 DIAGNOSIS — K219 Gastro-esophageal reflux disease without esophagitis: Secondary | ICD-10-CM

## 2014-02-05 DIAGNOSIS — M25673 Stiffness of unspecified ankle, not elsewhere classified: Secondary | ICD-10-CM

## 2014-02-05 DIAGNOSIS — M722 Plantar fascial fibromatosis: Secondary | ICD-10-CM

## 2014-02-05 LAB — POCT GLYCOSYLATED HEMOGLOBIN (HGB A1C): Hemoglobin A1C: 6.2

## 2014-02-05 NOTE — Progress Notes (Signed)
   Subjective:    Patient ID: Tammy Nicholson, female    DOB: 1970-12-14, 43 y.o.   MRN: [id number]  HPI IFG - she hsa been doing yoga and has been eating better.no increased thirst or urination.  Heel spur - doing PT for this through Dr.Thekkekandam.  Back pain- seeng her chriopractor.  Say this has really helped her back and her headaches.    GERD- Nexium is working for her acid reflux. She still has bloating and gas.   Follow-up hypertension-she was well-controlled but was getting frequent palpitations so we actually switched her to metoprolol about 3 months ago.she's been doing wellness and happy with her regimen.   Review of Systems     Objective:   Physical Exam  Constitutional: She is oriented to person, place, and time. She appears well-developed and well-nourished.  HENT:  Head: Normocephalic and atraumatic.  Left TM and canal are clear. No sign cerumen blockage.   Cardiovascular: Normal rate, regular rhythm and normal heart sounds.   Pulmonary/Chest: Effort normal and breath sounds normal.  Neurological: She is alert and oriented to person, place, and time.  Skin: Skin is warm and dry.  Psychiatric: She has a normal mood and affect. Her behavior is normal.          Assessment & Plan:  IFG - A1C is down to 6.2. Great job.  Doing well on metformin. F/U 4 mo. Continue work on diet and exercise.   GERD-well-controlled on current regimen.   heal spur-continue physical therapy.   Back pain-getting significant relief with working with her chiropractor.  Hypertension-well-controlled on metoprolol. Palpitations are under better control. Continue current regimen. Follow up in 4 months.  Had flu shot 12/30/13.

## 2014-02-06 ENCOUNTER — Ambulatory Visit: Payer: 59 | Admitting: Physical Therapy

## 2014-02-10 ENCOUNTER — Encounter (INDEPENDENT_AMBULATORY_CARE_PROVIDER_SITE_OTHER): Payer: 59 | Admitting: Physical Therapy

## 2014-02-10 DIAGNOSIS — M722 Plantar fascial fibromatosis: Secondary | ICD-10-CM

## 2014-02-10 DIAGNOSIS — R262 Difficulty in walking, not elsewhere classified: Secondary | ICD-10-CM

## 2014-02-10 DIAGNOSIS — M25579 Pain in unspecified ankle and joints of unspecified foot: Secondary | ICD-10-CM

## 2014-02-10 DIAGNOSIS — M25673 Stiffness of unspecified ankle, not elsewhere classified: Secondary | ICD-10-CM

## 2014-02-12 ENCOUNTER — Encounter (INDEPENDENT_AMBULATORY_CARE_PROVIDER_SITE_OTHER): Payer: 59 | Admitting: Physical Therapy

## 2014-02-12 DIAGNOSIS — M25673 Stiffness of unspecified ankle, not elsewhere classified: Secondary | ICD-10-CM

## 2014-02-12 DIAGNOSIS — M722 Plantar fascial fibromatosis: Secondary | ICD-10-CM

## 2014-02-12 DIAGNOSIS — M25539 Pain in unspecified wrist: Secondary | ICD-10-CM

## 2014-02-12 DIAGNOSIS — R262 Difficulty in walking, not elsewhere classified: Secondary | ICD-10-CM

## 2014-02-13 ENCOUNTER — Ambulatory Visit: Payer: 59 | Admitting: Physical Therapy

## 2014-02-27 ENCOUNTER — Encounter (INDEPENDENT_AMBULATORY_CARE_PROVIDER_SITE_OTHER): Payer: 59 | Admitting: Physical Therapy

## 2014-02-27 ENCOUNTER — Encounter: Payer: Self-pay | Admitting: Sports Medicine

## 2014-02-27 ENCOUNTER — Ambulatory Visit (INDEPENDENT_AMBULATORY_CARE_PROVIDER_SITE_OTHER): Payer: 59 | Admitting: Sports Medicine

## 2014-02-27 VITALS — BP 137/81 | HR 86 | Ht 63.0 in

## 2014-02-27 DIAGNOSIS — R262 Difficulty in walking, not elsewhere classified: Secondary | ICD-10-CM

## 2014-02-27 DIAGNOSIS — M51369 Other intervertebral disc degeneration, lumbar region without mention of lumbar back pain or lower extremity pain: Secondary | ICD-10-CM | POA: Insufficient documentation

## 2014-02-27 DIAGNOSIS — M722 Plantar fascial fibromatosis: Secondary | ICD-10-CM

## 2014-02-27 DIAGNOSIS — M25673 Stiffness of unspecified ankle, not elsewhere classified: Secondary | ICD-10-CM

## 2014-02-27 DIAGNOSIS — M5030 Other cervical disc degeneration, unspecified cervical region: Secondary | ICD-10-CM | POA: Insufficient documentation

## 2014-02-27 DIAGNOSIS — M25579 Pain in unspecified ankle and joints of unspecified foot: Secondary | ICD-10-CM

## 2014-02-27 DIAGNOSIS — M5136 Other intervertebral disc degeneration, lumbar region: Secondary | ICD-10-CM

## 2014-02-27 NOTE — Assessment & Plan Note (Signed)
Without radiculopathy but with C5-C6 degenerative changes on an x-ray done at the chiropractic clinic. Again, continue chiropractic care and if continues to improve we can avoid any aggressive or interventional treatment. If pain relief does plateau I'm happy to obtain an MRI for interventional planning.

## 2014-02-27 NOTE — Progress Notes (Signed)
  Subjective:    CC: Follow-up  HPI: Plantar fasciitis: Resolved with custom orthotics and formal physical therapy.  Neck pain: Moderate, persistent, currently being treated by chiropractor and improving.  Low back pain: Localized at the right SI joint, moderate, persistent but improving slightly with chiropractic care.  Past medical history, Surgical history, Family history not pertinant except as noted below, Social history, Allergies, and medications have been entered into the medical record, reviewed, and no changes needed.   Review of Systems: No fevers, chills, night sweats, weight loss, chest pain, or shortness of breath.   Objective:    General: Well Developed, well nourished, and in no acute distress.  Neuro: Alert and oriented x3, extra-ocular muscles intact, sensation grossly intact.  HEENT: Normocephalic, atraumatic, pupils equal round reactive to light, neck supple, no masses, no lymphadenopathy, thyroid nonpalpable.  Skin: Warm and dry, no rashes. Cardiac: Regular rate and rhythm, no murmurs rubs or gallops, no lower extremity edema.  Respiratory: Clear to auscultation bilaterally. Not using accessory muscles, speaking in full sentences. Back Exam:  Inspection: Unremarkable  Motion: Flexion 45 deg, Extension 45 deg, Side Bending to 45 deg bilaterally,  Rotation to 45 deg bilaterally  SLR laying: Negative  XSLR laying: Negative  Palpable tenderness: Right sacroiliac joint. FABER: negative. Sensory change: Gross sensation intact to all lumbar and sacral dermatomes.  Reflexes: 2+ at both patellar tendons, 2+ at achilles tendons, Babinski's downgoing.  Strength at foot  Plantar-flexion: 5/5 Dorsi-flexion: 5/5 Eversion: 5/5 Inversion: 5/5  Leg strength  Quad: 5/5 Hamstring: 5/5 Hip flexor: 5/5 Hip abductors: 5/5  Gait unremarkable.  X-rays were reviewed from the chiropractor and they show C5-C6 degenerative disc disease as well as L5-S1 degenerative disc disease, there  is some visible scoliosis however AP x-rays were taken with slight rotation. The proximal ends of the clavicles were not equidistant from the spinous processes of the thoracic vertebrae.  Impression and Recommendations:

## 2014-02-27 NOTE — Assessment & Plan Note (Signed)
Doing well with custom orthotics and physical therapy. Return as needed for the plantar fasciitis, interventional treatment would be next. Iontophoresis has been particularly effective.

## 2014-02-27 NOTE — Assessment & Plan Note (Signed)
L5-S1 degenerative changes noted on recent x-rays at the chiropractic clinic. Clinically pain is referable to the right sacroiliac joint. Continue chiropractic care for now, if plateaus I would be happy to initially try a diagnostic and therapeutic injection into the right sacroiliac joint here in the office. If no response to that and certainly we can consider lumbar spine MRI.

## 2014-03-05 ENCOUNTER — Encounter: Payer: Self-pay | Admitting: Physical Therapy

## 2014-03-12 ENCOUNTER — Encounter: Payer: 59 | Admitting: Physical Therapy

## 2014-04-17 ENCOUNTER — Other Ambulatory Visit: Payer: Self-pay | Admitting: Family Medicine

## 2014-04-18 ENCOUNTER — Ambulatory Visit: Payer: 59 | Admitting: Family Medicine

## 2014-04-22 ENCOUNTER — Other Ambulatory Visit: Payer: Self-pay | Admitting: Family Medicine

## 2014-05-05 ENCOUNTER — Ambulatory Visit (INDEPENDENT_AMBULATORY_CARE_PROVIDER_SITE_OTHER): Payer: 59

## 2014-05-05 ENCOUNTER — Ambulatory Visit (INDEPENDENT_AMBULATORY_CARE_PROVIDER_SITE_OTHER): Payer: 59 | Admitting: Family Medicine

## 2014-05-05 ENCOUNTER — Telehealth: Payer: Self-pay | Admitting: *Deleted

## 2014-05-05 ENCOUNTER — Encounter: Payer: Self-pay | Admitting: Family Medicine

## 2014-05-05 VITALS — BP 118/73 | HR 76 | Temp 98.0°F | Ht 63.0 in | Wt 180.0 lb

## 2014-05-05 DIAGNOSIS — R9431 Abnormal electrocardiogram [ECG] [EKG]: Secondary | ICD-10-CM

## 2014-05-05 DIAGNOSIS — R079 Chest pain, unspecified: Secondary | ICD-10-CM

## 2014-05-05 DIAGNOSIS — R1011 Right upper quadrant pain: Secondary | ICD-10-CM

## 2014-05-05 DIAGNOSIS — R1013 Epigastric pain: Secondary | ICD-10-CM

## 2014-05-05 DIAGNOSIS — I4581 Long QT syndrome: Secondary | ICD-10-CM

## 2014-05-05 DIAGNOSIS — R918 Other nonspecific abnormal finding of lung field: Secondary | ICD-10-CM

## 2014-05-05 DIAGNOSIS — R140 Abdominal distension (gaseous): Secondary | ICD-10-CM

## 2014-05-05 LAB — COMPLETE METABOLIC PANEL WITH GFR
ALT: 53 U/L — AB (ref 0–35)
AST: 29 U/L (ref 0–37)
Albumin: 4.2 g/dL (ref 3.5–5.2)
Alkaline Phosphatase: 60 U/L (ref 39–117)
BUN: 11 mg/dL (ref 6–23)
CALCIUM: 9.6 mg/dL (ref 8.4–10.5)
CHLORIDE: 99 meq/L (ref 96–112)
CO2: 25 mEq/L (ref 19–32)
CREATININE: 0.48 mg/dL — AB (ref 0.50–1.10)
GFR, Est African American: >89 mL/min
GFR, Est Non African American: >89 mL/min
Glucose, Bld: 140 mg/dL — ABNORMAL HIGH (ref 70–99)
POTASSIUM: 3.8 meq/L (ref 3.5–5.3)
SODIUM: 135 meq/L (ref 135–145)
TOTAL PROTEIN: 7.6 g/dL (ref 6.0–8.3)
Total Bilirubin: 1 mg/dL (ref 0.2–1.2)

## 2014-05-05 LAB — CBC WITH DIFFERENTIAL/PLATELET
Basophils Absolute: 0 10*3/uL (ref 0.0–0.1)
Basophils Relative: 0 % (ref 0–1)
EOS PCT: 2 % (ref 0–5)
Eosinophils Absolute: 0.1 10*3/uL (ref 0.0–0.7)
HCT: 40.9 % (ref 36.0–46.0)
HEMOGLOBIN: 14 g/dL (ref 12.0–15.0)
LYMPHS ABS: 1.7 10*3/uL (ref 0.7–4.0)
Lymphocytes Relative: 29 % (ref 12–46)
MCH: 30 pg (ref 26.0–34.0)
MCHC: 34.2 g/dL (ref 30.0–36.0)
MCV: 87.8 fL (ref 78.0–100.0)
MONO ABS: 0.5 10*3/uL (ref 0.1–1.0)
MPV: 10.6 fL (ref 8.6–12.4)
Monocytes Relative: 8 % (ref 3–12)
NEUTROS ABS: 3.5 10*3/uL (ref 1.7–7.7)
NEUTROS PCT: 61 % (ref 43–77)
Platelets: 272 10*3/uL (ref 150–400)
RBC: 4.66 MIL/uL (ref 3.87–5.11)
RDW: 14.1 % (ref 11.5–15.5)
WBC: 5.8 10*3/uL (ref 4.0–10.5)

## 2014-05-05 LAB — MAGNESIUM: Magnesium: 1.8 mg/dL (ref 1.5–2.5)

## 2014-05-05 MED ORDER — SUCRALFATE 1 GM/10ML PO SUSP: 1.0000 g | Freq: Three times a day (TID) | ORAL | Status: DC

## 2014-05-05 NOTE — Telephone Encounter (Signed)
Ok to change to tab

## 2014-05-05 NOTE — Telephone Encounter (Signed)
Pt stated that this med will cost $85.00. Wanted to know if there is something else that can be sent.   [facility] can change this to a tablet to save $ for the pt. Please change if ok. Fwd to pcp for advice.Loralee PacasBarkley, Tammy Nicholson [place]by[place]Lynetta

## 2014-05-05 NOTE — Progress Notes (Signed)
   Subjective:    Patient ID: Tammy Nicholson, female    DOB: 1971-03-27, 44 y.o.   MRN: [id number]  HPI For 2 weeks has been gassy, heavy in her chest and has had some right sided pain after eats red meat. Was started on Singulari 2 months ago.  Has been taking Gas-X before and after meals. She is very worried about a heart issue.  Has a lot of pain after eating. Says affects her sleep and tosses and turn.  Has noticed an inc in her BP as well when she is uncomfortable. She is already on Nexium. Stomach gets upset every time she eats. She denies any dysphagia   Review of Systems     Objective:   Physical Exam  Constitutional: She is oriented to person, place, and time. She appears well-developed and well-nourished.  HENT:  Head: Normocephalic and atraumatic.  Cardiovascular: Normal rate, regular rhythm and normal heart sounds.   Pulmonary/Chest: Effort normal and breath sounds normal.  Abdominal: Soft. Bowel sounds are normal. She exhibits no distension and no mass. There is tenderness. There is no rebound and no guarding.  TTP in the epigastric area, RUQ quad and LLQ.   Neurological: She is alert and oriented to person, place, and time.  Skin: Skin is warm and dry.  Psychiatric: She has a normal mood and affect. Her behavior is normal.          Assessment & Plan:  Epigastric pain - suspect dyspepsia likely related to her Singulair. I will have her stop this for about 2 weeks to see if she feels better. She is Re: On a PPI and using Gas-X. Also like to evaluate her gallbladder since eating things like red meat tend to increase her symptoms. Trial of carafate for symptomatic relief.   Atypical chest pain - unlikely to be cardiac but patient was very fearful about heart disease so we did decide to do an EKG today.  EKG shows rate of 76 bpm, left axis deviation, poor R wave progression. No acute ST-T wave changes. Prolonged QTc interval.    Prolonged QTc interval - will check calcium,  magnesium, potassium.

## 2014-05-06 MED ORDER — SUCRALFATE 1 G PO TABS: 1.0000 g | ORAL_TABLET | Freq: Three times a day (TID) | ORAL | Status: DC

## 2014-05-06 NOTE — Telephone Encounter (Signed)
Pt informed.Tammy Nicholson Tammy Nicholson  

## 2014-05-06 NOTE — Telephone Encounter (Signed)
Med changed.Loralee PacasBarkley, Trinady Milewski [place]etta

## 2014-05-09 ENCOUNTER — Ambulatory Visit (INDEPENDENT_AMBULATORY_CARE_PROVIDER_SITE_OTHER): Payer: 59

## 2014-05-09 DIAGNOSIS — R932 Abnormal findings on diagnostic imaging of liver and biliary tract: Secondary | ICD-10-CM

## 2014-05-09 DIAGNOSIS — R140 Abdominal distension (gaseous): Secondary | ICD-10-CM

## 2014-05-09 DIAGNOSIS — R079 Chest pain, unspecified: Secondary | ICD-10-CM

## 2014-05-09 DIAGNOSIS — R1011 Right upper quadrant pain: Secondary | ICD-10-CM

## 2014-05-09 DIAGNOSIS — R1013 Epigastric pain: Secondary | ICD-10-CM

## 2014-05-10 ENCOUNTER — Encounter: Payer: Self-pay | Admitting: Family Medicine

## 2014-05-12 ENCOUNTER — Other Ambulatory Visit: Payer: Self-pay | Admitting: Family Medicine

## 2014-05-12 DIAGNOSIS — R0602 Shortness of breath: Secondary | ICD-10-CM

## 2014-05-14 ENCOUNTER — Telehealth: Payer: Self-pay

## 2014-05-14 ENCOUNTER — Ambulatory Visit (HOSPITAL_BASED_OUTPATIENT_CLINIC_OR_DEPARTMENT_OTHER)
Admission: RE | Admit: 2014-05-14 | Discharge: 2014-05-14 | Disposition: A | Discharge status: Home / Self Care | Payer: 59 | Source: Ambulatory Visit | Attending: Family Medicine | Admitting: Family Medicine

## 2014-05-14 DIAGNOSIS — R079 Chest pain, unspecified: Secondary | ICD-10-CM

## 2014-05-14 NOTE — Progress Notes (Signed)
  Echocardiogram 2D Echocardiogram has been performed.  Leta JunglingCooper, Aslan Himes M 05/14/2014, 1:44 PM

## 2014-05-14 NOTE — Telephone Encounter (Signed)
PA required for 2D echocardiogram - Approved - [id number] - expires 06/28/2014

## 2014-05-19 ENCOUNTER — Telehealth: Payer: Self-pay | Admitting: *Deleted

## 2014-05-19 ENCOUNTER — Other Ambulatory Visit: Payer: Self-pay | Admitting: Family Medicine

## 2014-05-19 NOTE — Telephone Encounter (Signed)
-----   Message from Agapito Gamesatherine D [name], MD sent at 05/15/2014  9:48 PM EST ----- Call pt: Echo of heart looks goodt!! Nothing worriseome.  See how she is feeling.  Is she better off the singulair?

## 2014-05-19 NOTE — Telephone Encounter (Signed)
-----   Message from Agapito Gamesatherine D [name], MD sent at 05/12/2014  9:54 AM EST ----- Call pt: liver shows fatty infilatration.  This is a common cause of elevated liver enzymes.  Treatment is low fat diet, exercise for 30 min 5 days per week and weight loss. No other abnormalities.

## 2014-05-19 NOTE — Telephone Encounter (Signed)
[phone] (Home) [phone] [facility]([place])      Spoke with patient concerning results

## 2014-05-19 NOTE — Telephone Encounter (Signed)
[phone] (Home) [phone] [facility]([place])  Spoke with patient about results & she feels better. She is taking the new medication. And no longer having gastric issues since stopping singular.

## 2014-05-26 ENCOUNTER — Encounter: Payer: Self-pay | Admitting: Family Medicine

## 2014-05-26 ENCOUNTER — Ambulatory Visit (INDEPENDENT_AMBULATORY_CARE_PROVIDER_SITE_OTHER): Payer: 59 | Admitting: Family Medicine

## 2014-05-26 VITALS — BP 117/78 | HR 79 | Ht 63.0 in | Wt 183.0 lb

## 2014-05-26 DIAGNOSIS — H9312 Tinnitus, left ear: Secondary | ICD-10-CM

## 2014-05-26 DIAGNOSIS — R197 Diarrhea, unspecified: Secondary | ICD-10-CM

## 2014-05-26 DIAGNOSIS — R0789 Other chest pain: Secondary | ICD-10-CM

## 2014-05-26 DIAGNOSIS — K219 Gastro-esophageal reflux disease without esophagitis: Secondary | ICD-10-CM

## 2014-05-26 DIAGNOSIS — F4323 Adjustment disorder with mixed anxiety and depressed mood: Secondary | ICD-10-CM

## 2014-05-26 NOTE — Progress Notes (Signed)
   Subjective:    Patient ID: Tammy Nicholson, female    DOB: Dec 07, 1970, 44 y.o.   MRN: [id number]  HPI 3 wk f/u abd and chest pain. pt stated that last wk she has some diarrhea tuesday- thursday that was very watery. by sunday  No N/V, fever or chills.  Had a normal BM yesterday.  No blood in tthe stool. no recent travel. No recent hiking or camping.    Chest pain has resolved sine getting off the singulair. Marland Kitchen.     GERD - Says the carafate helps just hard time getting in dose since have to take it one hour before.    Did have immunotherapy in 2008 but stopped bc was getting vertigo.   Occ humming in her left ear. No pain or drainage, no trauma.    Her mom's breast cancer in back. She has been meditating and tryin gto focus. Says can't sleep.  She is feeling rush of anxiety. She has been feeling more down.  She wants to know what to do since she feels like some of her symptoms are out of control.  Review of Systems     Objective:   Physical Exam  Constitutional: She is oriented to person, place, and time. She appears well-developed and well-nourished.  HENT:  Head: Normocephalic and atraumatic.  She does have distorted light reflex on the left tympanic membrane. The right is blocked by some dry cerumen.  Eyes: Conjunctivae and EOM are normal.  Cardiovascular: Normal rate.   Pulmonary/Chest: Effort normal.  Neurological: She is alert and oriented to person, place, and time.  Skin: Skin is dry. No pallor.  Psychiatric: She has a normal mood and affect. Her behavior is normal.          Assessment & Plan:  Diarrhea-seems to be improving on its own. Certainly if it recurs then please call back and let us know and we can consider further evaluation with stool testing. The she has not had any red flag symptoms such as intense abdominal pain, nausea or fever etc. Next  Chest pain-seems to have resolved off of the Singulair.  GERD-continue his Carafate. Helped her figure out how to  better dose with the PPIs and she has to the Carafate one hour before meals. Explained her that the Carafate a short-term just for a week or 2 to help get things under control and then she will continue the PPI. Gas-X is up to her whether not she wants to take it if it is helping her with symptoms or not. Next  Ringing, left-distorted light reflex. Tympanogram performed.  Adjustment disorder-will refer for therapy. Also discussed medication can be helpful if needed.

## 2014-06-02 ENCOUNTER — Other Ambulatory Visit: Payer: Self-pay | Admitting: *Deleted

## 2014-06-02 ENCOUNTER — Other Ambulatory Visit: Payer: Self-pay | Admitting: Family Medicine

## 2014-06-02 DIAGNOSIS — R0602 Shortness of breath: Secondary | ICD-10-CM

## 2014-06-03 LAB — RESPIRATORY ALLERGY PROFILE REGION II ~~LOC~~
Allergen, Cedar tree, t12: <0.1 kU/L
Allergen, Comm Silver Birch, t9: <0.1 kU/L
Allergen, Cottonwood, t14: <0.1 kU/L
Allergen, D pternoyssinus,d7: 0.23 kU/L — ABNORMAL HIGH
Allergen, Mouse Urine Protein, e78: <0.1 kU/L
Allergen, Mulberry, t76: <0.1 kU/L
Aspergillus fumigatus, m3: <0.1 kU/L
Bermuda Grass: <0.1 kU/L
Box Elder IgE: <0.1 kU/L
COCKROACH: 0.71 kU/L — AB
COMMON RAGWEED: 3.21 kU/L — AB
Cat Dander: 0.71 kU/L — ABNORMAL HIGH
Cladosporium Herbarum: <0.1 kU/L
D. farinae: 0.2 kU/L — ABNORMAL HIGH
DOG DANDER: 0.16 kU/L — AB
Elm IgE: <0.1 kU/L
IGE (IMMUNOGLOBULIN E), SERUM: 34 kU/L (ref <115)
Oak: <0.1 kU/L
Pecan/Hickory Tree IgE: <0.1 kU/L
Sheep Sorrel IgE: <0.1 kU/L
Timothy Grass: <0.1 kU/L

## 2014-06-05 ENCOUNTER — Ambulatory Visit (INDEPENDENT_AMBULATORY_CARE_PROVIDER_SITE_OTHER): Payer: 59 | Admitting: Licensed Clinical Social Worker

## 2014-06-05 DIAGNOSIS — F332 Major depressive disorder, recurrent severe without psychotic features: Secondary | ICD-10-CM

## 2014-06-09 ENCOUNTER — Ambulatory Visit (INDEPENDENT_AMBULATORY_CARE_PROVIDER_SITE_OTHER): Payer: 59 | Admitting: Obstetrics & Gynecology

## 2014-06-09 ENCOUNTER — Encounter: Payer: Self-pay | Admitting: Obstetrics & Gynecology

## 2014-06-09 ENCOUNTER — Ambulatory Visit: Payer: 59 | Admitting: Family Medicine

## 2014-06-09 VITALS — BP 116/77 | HR 74 | Resp 16 | Ht 63.0 in | Wt 182.0 lb

## 2014-06-09 DIAGNOSIS — K625 Hemorrhage of anus and rectum: Secondary | ICD-10-CM

## 2014-06-09 NOTE — Progress Notes (Signed)
   Subjective:    Patient ID: Tammy Nicholson, female    DOB: [birth year], 44 y.o.   MRN: [id number]  HPI  44 yo female presents for mass near anus and bleeding for one month.  She was told a mass was there several years ago and that it should be biopsied.  It has grown more and then has had the continuous spotting.  No constipation or diarrhea.  Patient also having questions about cycles, week long positive ovulation indicators, and fertility.  Pt has PCO and has menses every other month.  She was under the care of Dr. Jeannie FendY but took a break due to stress.  Patient advised to return.  Review of Systems  Constitutional: Negative.   Respiratory: Negative.   Cardiovascular: Negative.   Genitourinary: Positive for menstrual problem. Negative for pelvic pain.  Psychiatric/Behavioral: Negative.        Objective:   Physical Exam  Constitutional: She is oriented to person, place, and time. She appears well-developed and well-nourished. No distress.  HENT:  Head: Normocephalic and atraumatic.  Eyes: Conjunctivae are normal.  Pulmonary/Chest: Effort normal.  Abdominal: Soft. She exhibits no distension.  Genitourinary: Vagina normal.    No vaginal discharge found.  Musculoskeletal: She exhibits no tenderness.  Neurological: She is alert and oriented to person, place, and time.  Skin: Skin is warm and dry.  Psychiatric: She has a normal mood and affect.  Vitals reviewed.  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   1.  Given broad base and friable nature, will refer to general surgery for biopsy and removal. 2.  Infertility--advised to return to Dr. Jeannie FendY for further monitoring and timed intercourse.  She is 3 and is approaching end of reproductive lifespan. 3.  Pap up to date with negative HPV.

## 2014-06-12 ENCOUNTER — Ambulatory Visit (INDEPENDENT_AMBULATORY_CARE_PROVIDER_SITE_OTHER): Payer: 59 | Admitting: Licensed Clinical Social Worker

## 2014-06-12 DIAGNOSIS — F332 Major depressive disorder, recurrent severe without psychotic features: Secondary | ICD-10-CM

## 2014-06-13 ENCOUNTER — Ambulatory Visit (INDEPENDENT_AMBULATORY_CARE_PROVIDER_SITE_OTHER): Payer: 59 | Admitting: Family Medicine

## 2014-06-13 ENCOUNTER — Encounter: Payer: Self-pay | Admitting: Family Medicine

## 2014-06-13 VITALS — BP 123/80 | HR 81 | Wt 185.0 lb

## 2014-06-13 DIAGNOSIS — J302 Other seasonal allergic rhinitis: Secondary | ICD-10-CM | POA: Diagnosis not present

## 2014-06-13 DIAGNOSIS — E119 Type 2 diabetes mellitus without complications: Secondary | ICD-10-CM

## 2014-06-13 DIAGNOSIS — E8881 Metabolic syndrome: Secondary | ICD-10-CM

## 2014-06-13 LAB — POCT GLYCOSYLATED HEMOGLOBIN (HGB A1C): HEMOGLOBIN A1C: 6.5

## 2014-06-13 MED ORDER — BECLOMETHASONE DIPROPIONATE 80 MCG/ACT NA AERS: 1.0000 | INHALATION_SPRAY | Freq: Every day | NASAL | Status: AC

## 2014-06-13 MED ORDER — PREDNISONE 20 MG PO TABS: 40.0000 mg | ORAL_TABLET | Freq: Every day | ORAL | Status: DC

## 2014-06-13 NOTE — Progress Notes (Signed)
   Subjective:    Patient ID: Tammy Nicholson, female    DOB: 02/07/1971, 44 y.o.   MRN: [id number]  HPI IFG - she is on metformin.  Tolerating it well. No inc thirst or urination.  She takes it 3 times a day and says she has been consistent with it. She admits she's been eating more carbs lately. Her last A1c 4 months ago looked fantastic at 6.2.  AR- her allergies are severe again. She is using Allegra and steroid nasal spray at night. Says her allergies are making her cough and raspy.  She's having excess mucus production in the back of the throat. She was unable to tolerate allergy injections as they caused vertigo. She went to see her ENT who put her on Singulair and ended up causing GI pain so this was stopped. Her left ear in particular feels very full and she feels like she can has a constant buzzing noise in the ear. She had Re: Seeing ENT about this.   Review of Systems     Objective:   Physical Exam  Constitutional: She is oriented to person, place, and time. She appears well-developed and well-nourished.  HENT:  Head: Normocephalic and atraumatic.  Right Ear: External ear normal.  Left Ear: External ear normal.  Nose: Nose normal.  Mouth/Throat: Oropharynx is clear and moist.  TMs and canals are clear. She does have a small effusion in the left ear. No erythema.  Eyes: Conjunctivae and EOM are normal. Pupils are equal, round, and reactive to light.  Neck: Neck supple. No thyromegaly present.  Cardiovascular: Normal rate, regular rhythm and normal heart sounds.   Pulmonary/Chest: Effort normal and breath sounds normal. She has no wheezes.  Lymphadenopathy:    She has no cervical adenopathy.  Neurological: She is alert and oriented to person, place, and time.  Skin: Skin is warm and dry.  Psychiatric: She has a normal mood and affect. Her behavior is normal.          Assessment & Plan:  DM- Well controlled.  Continue current regimen. Discussed gain back on track with  diet next her size over the next 3 months. Follow-up in 3 months.  Allergic rhinitis-we are limited at this point with options. She's going to try Zyrtec instead of Allegra. Recommended a trial of daily nasal instead of the Nasacort. Coupon card provided. Unfortunately anything is not generic is an extremely high tear on her insurance so it may still be cost prohibitive even with the coupon. Unfortunately, there is no alternative to the Singulair. It is the only drug in its class. Because her symptoms are quite severe right now recommend a trial of low-dose prednisone for a week to see if we can get her symptoms under control.

## 2014-06-24 ENCOUNTER — Telehealth: Payer: Self-pay | Admitting: Family Medicine

## 2014-06-24 NOTE — Telephone Encounter (Signed)
Received fax for pa on QNASL sent through cover my meds waiting on auth.- cf

## 2014-06-24 NOTE — Telephone Encounter (Signed)
[facility] and [employer] LevelQNasl is approved until 06/24/15 or until coverage is no longer available under plan. Auth # [id number] - CF

## 2014-07-03 ENCOUNTER — Ambulatory Visit (INDEPENDENT_AMBULATORY_CARE_PROVIDER_SITE_OTHER): Payer: 59 | Admitting: Licensed Clinical Social Worker

## 2014-07-03 DIAGNOSIS — F332 Major depressive disorder, recurrent severe without psychotic features: Secondary | ICD-10-CM | POA: Diagnosis not present

## 2014-07-15 ENCOUNTER — Ambulatory Visit (INDEPENDENT_AMBULATORY_CARE_PROVIDER_SITE_OTHER): Payer: 59 | Admitting: Licensed Clinical Social Worker

## 2014-07-15 DIAGNOSIS — F332 Major depressive disorder, recurrent severe without psychotic features: Secondary | ICD-10-CM

## 2014-07-21 ENCOUNTER — Other Ambulatory Visit: Payer: Self-pay | Admitting: Family Medicine

## 2014-07-30 ENCOUNTER — Encounter: Payer: Self-pay | Admitting: *Deleted

## 2014-08-01 ENCOUNTER — Other Ambulatory Visit (HOSPITAL_COMMUNITY): Payer: Self-pay | Admitting: General Surgery

## 2014-08-05 ENCOUNTER — Ambulatory Visit: Payer: 59 | Admitting: Licensed Clinical Social Worker

## 2014-08-06 ENCOUNTER — Ambulatory Visit: Payer: 59 | Admitting: Family Medicine

## 2014-08-27 ENCOUNTER — Other Ambulatory Visit: Payer: Self-pay | Admitting: Family Medicine

## 2014-08-27 LAB — HM DIABETES EYE EXAM

## 2014-09-04 ENCOUNTER — Encounter: Payer: Self-pay | Admitting: Family Medicine

## 2014-09-18 ENCOUNTER — Other Ambulatory Visit: Payer: Self-pay | Admitting: Family Medicine

## 2014-09-18 ENCOUNTER — Other Ambulatory Visit: Payer: Self-pay | Admitting: *Deleted

## 2014-09-18 DIAGNOSIS — E119 Type 2 diabetes mellitus without complications: Secondary | ICD-10-CM

## 2014-09-18 MED ORDER — GLUCOSE BLOOD VI STRP: ORAL_STRIP | Status: DC

## 2014-09-19 ENCOUNTER — Other Ambulatory Visit: Payer: Self-pay | Admitting: *Deleted

## 2014-09-19 DIAGNOSIS — E119 Type 2 diabetes mellitus without complications: Secondary | ICD-10-CM

## 2014-09-19 MED ORDER — GLUCOSE BLOOD VI STRP: ORAL_STRIP | Status: AC

## 2014-09-30 ENCOUNTER — Telehealth: Payer: Self-pay | Admitting: Family Medicine

## 2014-09-30 DIAGNOSIS — Z13220 Encounter for screening for lipoid disorders: Secondary | ICD-10-CM

## 2014-09-30 NOTE — Telephone Encounter (Signed)
lvm informing pt that she will need to come in and have labs done ASAP. Lab hours given and pt advised to fast for at least 8 hours prior to having labs done. Order faxed.Loralee PacasBarkley, Trevian Hayashida Lynetta]

## 2014-09-30 NOTE — Telephone Encounter (Signed)
Please call patient: she hasn't had a cholesterol check in almost 3 years. This needs to be done ASAP. Please enter lipid and call to see if she can go soon.

## 2014-10-08 ENCOUNTER — Other Ambulatory Visit: Payer: Self-pay | Admitting: Family Medicine

## 2014-10-09 ENCOUNTER — Other Ambulatory Visit: Payer: Self-pay | Admitting: Family Medicine

## 2014-10-27 ENCOUNTER — Other Ambulatory Visit: Payer: Self-pay | Admitting: Family Medicine

## 2014-10-27 DIAGNOSIS — Z1231 Encounter for screening mammogram for malignant neoplasm of breast: Secondary | ICD-10-CM

## 2014-11-20 ENCOUNTER — Ambulatory Visit: Payer: Self-pay | Admitting: Family Medicine

## 2014-11-26 ENCOUNTER — Other Ambulatory Visit: Payer: Self-pay | Admitting: Family Medicine

## 2014-11-26 NOTE — Telephone Encounter (Signed)
Patient advised to keep her appointment in September.

## 2014-12-02 ENCOUNTER — Ambulatory Visit (HOSPITAL_BASED_OUTPATIENT_CLINIC_OR_DEPARTMENT_OTHER): Payer: Self-pay

## 2014-12-03 ENCOUNTER — Other Ambulatory Visit: Payer: Self-pay | Admitting: Family Medicine

## 2014-12-05 ENCOUNTER — Ambulatory Visit (HOSPITAL_BASED_OUTPATIENT_CLINIC_OR_DEPARTMENT_OTHER): Payer: Self-pay

## 2014-12-08 ENCOUNTER — Ambulatory Visit (HOSPITAL_BASED_OUTPATIENT_CLINIC_OR_DEPARTMENT_OTHER)
Admission: RE | Admit: 2014-12-08 | Discharge: 2014-12-08 | Disposition: A | Discharge status: Home / Self Care | Payer: 59 | Source: Ambulatory Visit | Attending: Family Medicine | Admitting: Family Medicine

## 2014-12-08 DIAGNOSIS — Z1231 Encounter for screening mammogram for malignant neoplasm of breast: Secondary | ICD-10-CM | POA: Diagnosis present

## 2014-12-12 ENCOUNTER — Encounter: Payer: Self-pay | Admitting: Family Medicine

## 2014-12-12 ENCOUNTER — Ambulatory Visit (INDEPENDENT_AMBULATORY_CARE_PROVIDER_SITE_OTHER): Payer: 59 | Admitting: Family Medicine

## 2014-12-12 VITALS — BP 120/78 | HR 67 | Temp 98.5°F | Ht 63.0 in | Wt 179.0 lb

## 2014-12-12 DIAGNOSIS — Z0000 Encounter for general adult medical examination without abnormal findings: Secondary | ICD-10-CM

## 2014-12-12 DIAGNOSIS — E119 Type 2 diabetes mellitus without complications: Secondary | ICD-10-CM | POA: Diagnosis not present

## 2014-12-12 DIAGNOSIS — I10 Essential (primary) hypertension: Secondary | ICD-10-CM | POA: Diagnosis not present

## 2014-12-12 DIAGNOSIS — Z23 Encounter for immunization: Secondary | ICD-10-CM | POA: Diagnosis not present

## 2014-12-12 LAB — POCT UA - MICROALBUMIN
CREATININE, POC: 200 mg/dL
Microalbumin Ur, POC: 80 mg/L

## 2014-12-12 LAB — LIPID PANEL
CHOLESTEROL: 187 mg/dL (ref 125–200)
HDL: 35 mg/dL — AB (ref >=46)
LDL Cholesterol: 132 mg/dL — ABNORMAL HIGH (ref <130)
Total CHOL/HDL Ratio: 5.3 Ratio — ABNORMAL HIGH (ref <=5.0)
Triglycerides: 101 mg/dL (ref <150)
VLDL: 20 mg/dL (ref <30)

## 2014-12-12 LAB — COMPLETE METABOLIC PANEL WITH GFR
ALK PHOS: 60 U/L (ref 33–115)
ALT: 83 U/L — ABNORMAL HIGH (ref 6–29)
AST: 43 U/L — ABNORMAL HIGH (ref 10–35)
Albumin: 4.4 g/dL (ref 3.6–5.1)
BUN: 12 mg/dL (ref 7–25)
CO2: 25 mmol/L (ref 20–31)
Calcium: 9.4 mg/dL (ref 8.6–10.2)
Chloride: 101 mmol/L (ref 98–110)
Creat: 0.53 mg/dL (ref 0.50–1.10)
GFR, Est African American: >89 mL/min (ref >=60)
GLUCOSE: 107 mg/dL — AB (ref 65–99)
Potassium: 4.4 mmol/L (ref 3.5–5.3)
Sodium: 137 mmol/L (ref 135–146)
Total Bilirubin: 0.5 mg/dL (ref 0.2–1.2)
Total Protein: 7.6 g/dL (ref 6.1–8.1)

## 2014-12-12 LAB — POCT GLYCOSYLATED HEMOGLOBIN (HGB A1C): Hemoglobin A1C: 6.4

## 2014-12-12 MED ORDER — HYDROCHLOROTHIAZIDE 25 MG PO TABS: ORAL_TABLET | ORAL | Status: AC

## 2014-12-12 MED ORDER — METFORMIN HCL 500 MG PO TABS: 500.0000 mg | ORAL_TABLET | Freq: Three times a day (TID) | ORAL | Status: AC

## 2014-12-12 MED ORDER — METOPROLOL TARTRATE 50 MG PO TABS: 50.0000 mg | ORAL_TABLET | Freq: Two times a day (BID) | ORAL | Status: AC

## 2014-12-12 NOTE — Patient Instructions (Signed)
Will need to follow up in [place]  In 3-4 months for your Diabetes  Take care of yourself!  It was a pleasure having you as a patient.  We will miss you!

## 2014-12-12 NOTE — Progress Notes (Signed)
Subjective:     Tammy Nicholson is a 44 y.o. female and is here for a comprehensive physical exam. The patient reports no problems. She just told her house and she is moving back to [place] to help take care of her parents. This will be her last visit with [place] today. She just wants to make sure that she has everything and check as well as her prescriptions.  Diabetes - no hypoglycemic events. No wounds or sores that are not healing well. No increased thirst or urination. Checking glucose at home. Taking medications as prescribed without any side effects.  Hypertension- Pt denies chest pain, SOB, dizziness, or heart palpitations.  Taking meds as directed w/o problems.  Denies medication side effects.    Social History   Social History  . Marital Status: Single    Spouse Name: N/A  . Number of Children: N/A  . Years of Education: N/A   Occupational History  . administrative asst    Social History Main Topics  . Smoking status: Never Smoker   . Smokeless tobacco: Never Used  . Alcohol Use: No  . Drug Use: No  . Sexual Activity:    Partners: Male    Birth Control/ Protection: None   Other Topics Concern  . Not on file   Social History Narrative   Health Maintenance  Topic Date Due  . URINE MICROALBUMIN  06/26/2014  . FOOT EXAM  11/05/2014  . INFLUENZA VACCINE  06/13/2015 (Originally 10/27/2014)  . HEMOGLOBIN A1C  12/14/2014  . OPHTHALMOLOGY EXAM  08/27/2015  . PAP SMEAR  11/28/2016  . PNEUMOCOCCAL POLYSACCHARIDE VACCINE (2) 11/05/2018  . TETANUS/TDAP  12/26/2020  . HIV Screening  Completed    The following portions of the patient's history were reviewed and updated as appropriate: allergies, current medications, past family history, past medical history, past social history, past surgical history and problem list.  Review of Systems A comprehensive review of systems was negative.   Objective:    BP 120/78 mmHg  Pulse 67  Temp(Src) 98.5 F (36.9 C)  Ht   (1.6 m)  Wt 179 lb (81.194 kg)  BMI 31.72 kg/m2 General appearance: alert, cooperative and appears stated age Head: Normocephalic, without obvious abnormality, atraumatic Eyes: conj clear, EOMI, PEERLA Ears: normal TM's and external ear canals both ears Nose: Nares normal. Septum midline. Mucosa normal. No drainage or sinus tenderness. Throat: lips, mucosa, and tongue normal; teeth and gums normal Neck: no adenopathy, no carotid bruit, no JVD, supple, symmetrical, trachea midline and thyroid not enlarged, symmetric, no tenderness/mass/nodules Back: symmetric, no curvature. ROM normal. No CVA tenderness. Lungs: clear to auscultation bilaterally Breasts: normal appearance, no masses or tenderness Heart: regular rate and rhythm, S1, S2 normal, no murmur, click, rub or gallop Abdomen: soft, non-tender; bowel sounds normal; no masses,  no organomegaly Extremities: extremities normal, atraumatic, no cyanosis or edema Pulses: 2+ and symmetric Skin: Skin color, texture, turgor normal. No rashes or lesions Lymph nodes: Cervical, supraclavicular, and axillary nodes normal. Neurologic: Alert and oriented X 3, normal strength and tone. Normal symmetric reflexes. Normal coordination and gait    Assessment:    Healthy female exam.      Plan:     See After Visit Summary for Counseling Recommendations  Keep up a regular exercise program and make sure you are eating a healthy diet Try to eat 4 servings of dairy a day, or if you are lactose intolerant take a calcium with vitamin D daily.  Your vaccines  are up to date.   DM- well controlled. A1C 6.4  -  urine microalbumin performed today. Foot exam performed today. Flu vaccine given today. Continue current regimen.continue metformin.  HTN - well controlled. Continue metoprolol and hydrochlorothiazide.  Flu shot given today.

## 2015-11-14 IMAGING — DX DG CHEST 2V
2 series · 2 of 2 positions shown · non-contrast
Comparison: May 15, 2008

CLINICAL DATA: Midsternal chest pain radiating to right lower rib
region for 1 week

EXAM:
CHEST  2 VIEW

[chest pa]
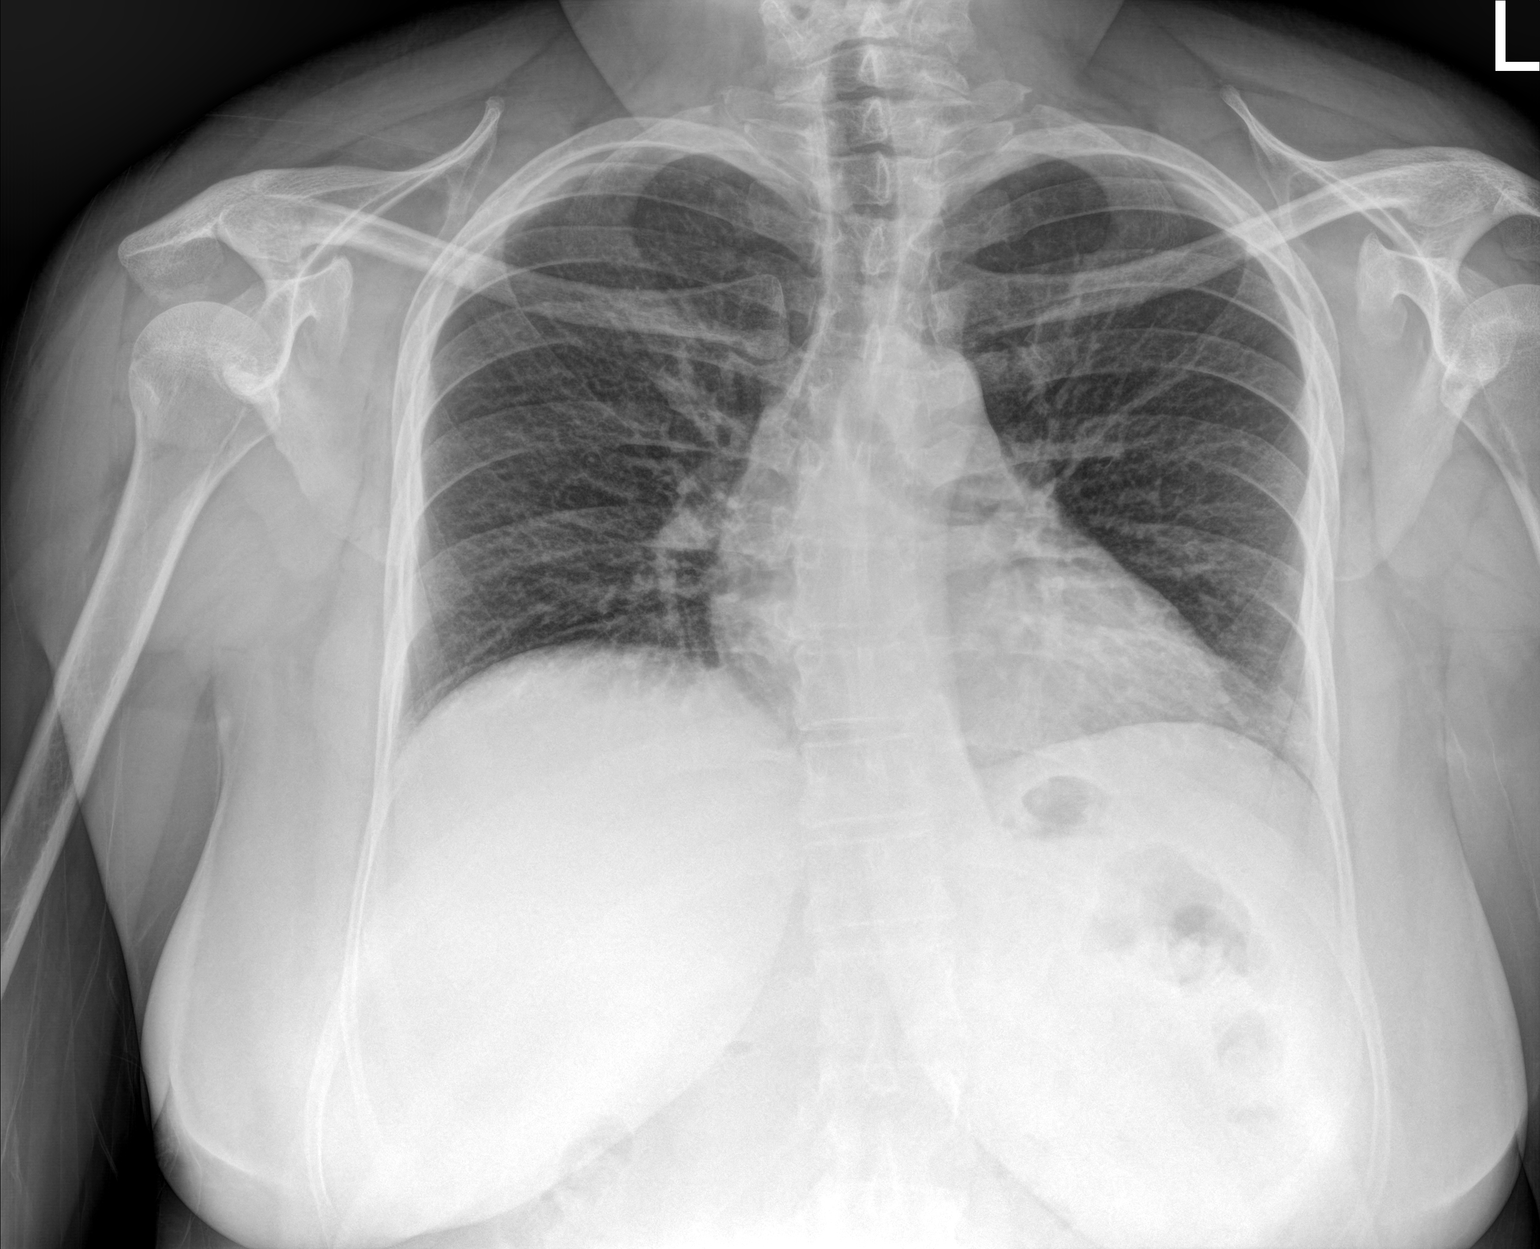

[chest lat]
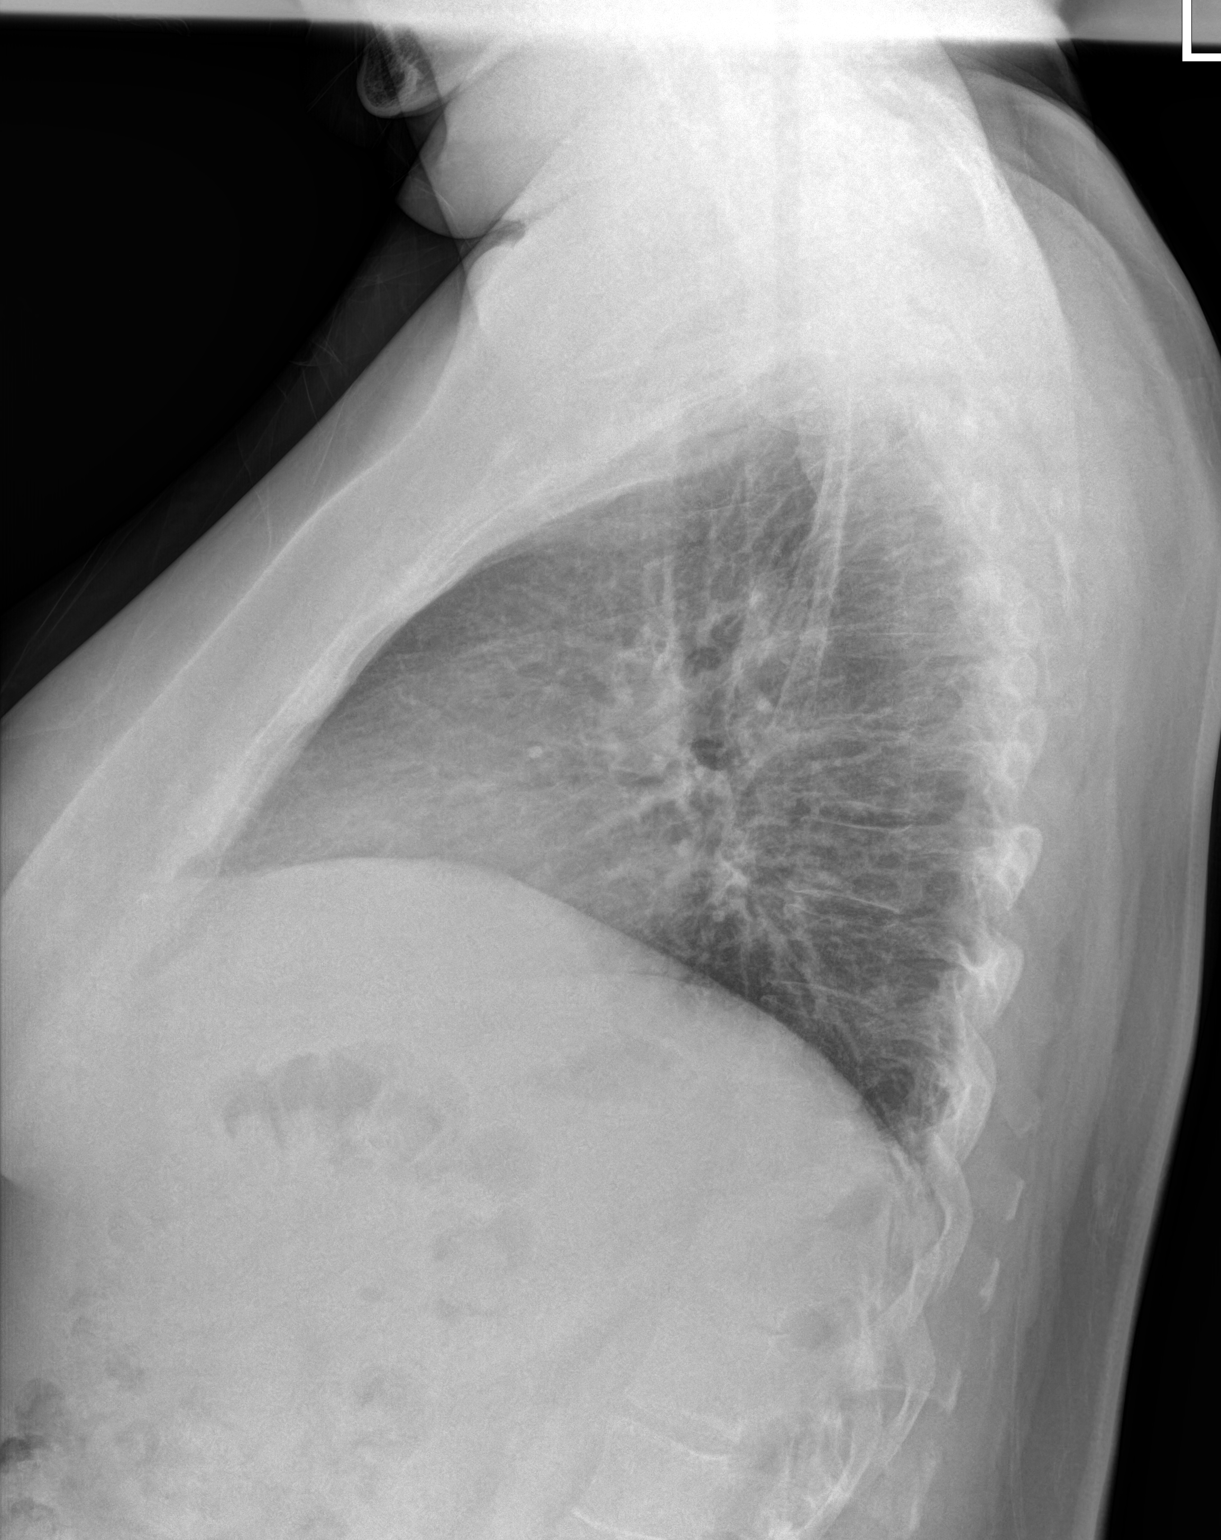

[2 of 2 positions shown; findings below may reference images not displayed]

FINDINGS: The degree of inspiration is shallow. Interstitium is mildly
prominent in a generalized manner. There may be mild interstitial
edema present. There is no airspace consolidation or effusion. Heart
size and pulmonary vascularity are normal. No adenopathy. There is
upper thoracic levoscoliosis. No pneumothorax. No focal bone
lesions.
IMPRESSION: Mild generalized interstitial prominence. Question mild interstitial
edema versus noncardiogenic interstitial prominence. Allergic type
phenomenon could account for this appearance. This interstitial
prominence does represent a change from the prior study. There is no
airspace consolidation. Question underlying restrictive disease
given the degree of shallow inspiratory effort.
